# Patient Record
Sex: Female | Born: 1965 | Race: Black or African American | Hispanic: Yes | Marital: Married | Smoking: Never smoker
Health system: Southern US, Community
[De-identification: ages and names within clinical notes are randomized; demographics above are authoritative.]

## PROBLEM LIST (undated history)

## (undated) DIAGNOSIS — B9689 Other specified bacterial agents as the cause of diseases classified elsewhere: Secondary | ICD-10-CM

## (undated) DIAGNOSIS — N83209 Unspecified ovarian cyst, unspecified side: Secondary | ICD-10-CM

## (undated) DIAGNOSIS — N951 Menopausal and female climacteric states: Secondary | ICD-10-CM

## (undated) DIAGNOSIS — Z789 Other specified health status: Secondary | ICD-10-CM

## (undated) DIAGNOSIS — I10 Essential (primary) hypertension: Secondary | ICD-10-CM

## (undated) DIAGNOSIS — N760 Acute vaginitis: Secondary | ICD-10-CM

## (undated) HISTORY — DX: Menopausal and female climacteric states: N95.1

## (undated) HISTORY — PX: NO PAST SURGERIES: SHX2092

## (undated) HISTORY — DX: Other specified bacterial agents as the cause of diseases classified elsewhere: B96.89

## (undated) HISTORY — DX: Unspecified ovarian cyst, unspecified side: N83.209

## (undated) HISTORY — DX: Essential (primary) hypertension: I10

## (undated) HISTORY — DX: Other specified health status: Z78.9

## (undated) HISTORY — PX: LAPAROSCOPIC OVARIAN CYSTECTOMY: SUR786

## (undated) HISTORY — DX: Other specified bacterial agents as the cause of diseases classified elsewhere: N76.0

---

## 2015-02-07 LAB — HIV ANTIBODY (ROUTINE TESTING W REFLEX): HIV: NEGATIVE

## 2015-02-07 LAB — HM HEPATITIS C SCREENING LAB: HM HEPATITIS C SCREENING: NEGATIVE

## 2017-05-23 LAB — HM PAP SMEAR: HM Pap smear: NEGATIVE

## 2017-10-14 ENCOUNTER — Ambulatory Visit (INDEPENDENT_AMBULATORY_CARE_PROVIDER_SITE_OTHER): Payer: BLUE CROSS/BLUE SHIELD | Admitting: Physician Assistant

## 2017-10-14 ENCOUNTER — Encounter: Payer: Self-pay | Admitting: Physician Assistant

## 2017-10-14 VITALS — BP 124/82 | HR 62 | Temp 98.3°F | Resp 16 | Wt 147.0 lb

## 2017-10-14 DIAGNOSIS — Z1231 Encounter for screening mammogram for malignant neoplasm of breast: Secondary | ICD-10-CM

## 2017-10-14 DIAGNOSIS — N915 Oligomenorrhea, unspecified: Secondary | ICD-10-CM | POA: Diagnosis not present

## 2017-10-14 DIAGNOSIS — Z1239 Encounter for other screening for malignant neoplasm of breast: Secondary | ICD-10-CM

## 2017-10-14 DIAGNOSIS — N952 Postmenopausal atrophic vaginitis: Secondary | ICD-10-CM | POA: Diagnosis not present

## 2017-10-14 DIAGNOSIS — D219 Benign neoplasm of connective and other soft tissue, unspecified: Secondary | ICD-10-CM

## 2017-10-14 MED ORDER — TRANEXAMIC ACID 650 MG PO TABS
1300.0000 mg | ORAL_TABLET | Freq: Three times a day (TID) | ORAL | 0 refills | Status: AC
Start: 2017-10-14 — End: 2017-11-13

## 2017-10-14 MED ORDER — ESTRADIOL 0.1 MG/GM VA CREA: 1.0000 | TOPICAL_CREAM | VAGINAL | 1 refills | Status: DC

## 2017-10-14 NOTE — Progress Notes (Signed)
Patient: Rose Marsh Female    DOB: 1965/07/26   52 y.o.   MRN: [id number] Visit Date: 10/14/2017  Today's Provider: Trinna Post, PA-C   Chief Complaint  Patient presents with  . Establish Care   Subjective:    HPI  Previously in [place], now living in [place] [facility] - in [place] in [place], [place]. First language spanish.  Perimenopausal. Has history of uterine fibroids. Takes Lysteda 650 mg tablets, two tablets three times daily for five days during menstrual cycle. Reports intermittent menstrual cycles. Complaining of vaginal dryness, painful sex. Previously on Prempro, no longer.    No Known Allergies   Current Outpatient Medications:  .  cetirizine (ZYRTEC) 10 MG tablet, Take 10 mg by mouth daily., Disp: , Rfl:  .  Cyanocobalamin 100 MCG LOZG, Take by mouth., Disp: , Rfl:  .  ferrous sulfate 325 (65 FE) MG tablet, Take by mouth., Disp: , Rfl:  .  tranexamic acid (LYSTEDA) 650 MG TABS tablet, Take 1,300 mg by mouth 3 (three) times daily. Take a max of 5 days during monthly menstruation., Disp: , Rfl:   Review of Systems  Constitutional: Negative.   HENT: Positive for postnasal drip and sinus pressure.   Eyes: Negative.   Respiratory: Negative.   Cardiovascular: Negative.   Gastrointestinal: Negative.   Endocrine: Negative.  Negative for polyuria.  Genitourinary: Negative.   Musculoskeletal: Negative.   Skin: Negative.   Allergic/Immunologic: Negative.   Neurological: Negative.   Hematological: Negative.   Psychiatric/Behavioral: Negative.     Social History   Tobacco Use  . Smoking status: Never Smoker  . Smokeless tobacco: Never Used  Substance Use Topics  . Alcohol use: Never    Frequency: Never   Objective:   BP 124/82 (BP Location: Right Arm, Patient Position: Sitting, Cuff Size: Normal)   Pulse 62   Temp 98.3 F (36.8 C) (Oral)   Resp  16   Wt 147 lb (66.7 kg)  Vitals:   10/14/17 0904  BP: 124/82  Pulse: 62  Resp: 16  Temp: 98.3 F (36.8 C)  TempSrc: Oral  Weight: 147 lb (66.7 kg)     Physical Exam  Constitutional: She is oriented to person, place, and time. She appears well-developed and well-nourished.  Cardiovascular: Normal rate and regular rhythm.  Pulmonary/Chest: Effort normal and breath sounds normal.  Neurological: She is alert and oriented to person, place, and time.  Skin: Skin is warm and dry.  Psychiatric: She has a normal mood and affect. Her behavior is normal.        Assessment & Plan:     1. Vaginal atrophy  Encouraged to use non-hormonal lubricants as first line. If not effective, can use below. Should follow up with OBGYN. Will fill out FMLA for one day per month for intolerable menopausal symptoms.  - estradiol (ESTRACE VAGINAL) 0.1 MG/GM vaginal cream; Place 1 Applicatorful vaginally 3 (three) times a week.  Dispense: 42.5 g; Refill: 1  2. Fibroids  - Ambulatory referral to Obstetrics / Gynecology  3. Oligomenorrhea, unspecified type  - Ambulatory referral to Obstetrics / Gynecology - tranexamic acid (LYSTEDA) 650 MG TABS tablet; Take 2 tablets (1,300 mg total) by mouth 3 (three) times daily. Take a max of 5 days during monthly menstruation.  Dispense: 180 tablet; Refill: 0  4. Breast cancer screening  - MM Digital Screening; Future  Return  in about 1 year (around 10/15/2018) for CPE.  The entirety of the information documented in the History of Present Illness, Review of Systems and Physical Exam were personally obtained by me. Portions of this information were initially documented by Ashley Royalty, CMA and reviewed by me for thoroughness and accuracy.        Trinna Post, PA-C  [facility]

## 2017-10-14 NOTE — Patient Instructions (Signed)
Uterine Fibroids Uterine fibroids are tissue masses (tumors) that can develop in the womb (uterus). They are also called leiomyomas. This type of tumor is not cancerous (benign) and does not spread to other parts of the body outside of the pelvic area, which is between the hip bones. Occasionally, fibroids may develop in the fallopian tubes, in the cervix, or on the support structures (ligaments) that surround the uterus. You can have one or many fibroids. Fibroids can vary in size, weight, and where they grow in the uterus. Some can become quite large. Most fibroids do not require medical treatment. What are the causes? A fibroid can develop when a single uterine cell keeps growing (replicating). Most cells in the human body have a control mechanism that keeps them from replicating without control. What are the signs or symptoms? Symptoms may include:  Heavy bleeding during your period.  Bleeding or spotting between periods.  Pelvic pain and pressure.  Bladder problems, such as needing to urinate more often (urinary frequency) or urgently.  Inability to reproduce offspring (infertility).  Miscarriages.  How is this diagnosed? Uterine fibroids are diagnosed through a physical exam. Your health care provider may feel the lumpy tumors during a pelvic exam. Ultrasonography and an MRI may be done to determine the size, location, and number of fibroids. How is this treated? Treatment may include:  Watchful waiting. This involves getting the fibroid checked by your health care provider to see if it grows or shrinks. Follow your health care provider's recommendations for how often to have this checked.  Hormone medicines. These can be taken by mouth or given through an intrauterine device (IUD).  Surgery. ? Removing the fibroids (myomectomy) or the uterus (hysterectomy). ? Removing blood supply to the fibroids (uterine artery embolization).  If fibroids interfere with your fertility and you  want to become pregnant, your health care provider may recommend having the fibroids removed. Follow these instructions at home:  Keep all follow-up visits as directed by your health care provider. This is important.  Take over-the-counter and prescription medicines only as told by your health care provider. ? If you were prescribed a hormone treatment, take the hormone medicines exactly as directed.  Ask your health care provider about taking iron pills and increasing the amount of dark green, leafy vegetables in your diet. These actions can help to boost your blood iron levels, which may be affected by heavy menstrual bleeding.  Pay close attention to your period and tell your health care provider about any changes, such as: ? Increased blood flow that requires you to use more pads or tampons than usual per month. ? A change in the number of days that your period lasts per month. ? A change in symptoms that are associated with your period, such as abdominal cramping or back pain. Contact a health care provider if:  You have pelvic pain, back pain, or abdominal cramps that cannot be controlled with medicines.  You have an increase in bleeding between and during periods.  You soak tampons or pads in a half hour or less.  You feel lightheaded, extra tired, or weak. Get help right away if:  You faint.  You have a sudden increase in pelvic pain. This information is not intended to replace advice given to you by your health care provider. Make sure you discuss any questions you have with your health care provider. Document Released: 04/19/2000 Document Revised: 12/21/2015 Document Reviewed: 10/19/2013 Elsevier Interactive Patient Education  2018 Elsevier Inc.  

## 2017-10-15 ENCOUNTER — Telehealth: Payer: Self-pay | Admitting: Physician Assistant

## 2017-10-15 NOTE — Telephone Encounter (Signed)
Received [facility] FMLA form. Placed form in providers box. Thanks CC

## 2017-10-16 ENCOUNTER — Encounter: Payer: Self-pay | Admitting: Physician Assistant

## 2017-10-20 NOTE — Telephone Encounter (Signed)
FYI

## 2017-10-22 NOTE — Telephone Encounter (Signed)
Form completed.

## 2017-11-11 ENCOUNTER — Ambulatory Visit: Payer: BLUE CROSS/BLUE SHIELD | Admitting: Obstetrics and Gynecology

## 2017-11-11 ENCOUNTER — Encounter: Payer: Self-pay | Admitting: Obstetrics and Gynecology

## 2017-11-11 VITALS — BP 124/82 | HR 82 | Ht 67.0 in | Wt 145.0 lb

## 2017-11-11 DIAGNOSIS — N941 Unspecified dyspareunia: Secondary | ICD-10-CM

## 2017-11-11 DIAGNOSIS — N951 Menopausal and female climacteric states: Secondary | ICD-10-CM | POA: Diagnosis not present

## 2017-11-11 DIAGNOSIS — N952 Postmenopausal atrophic vaginitis: Secondary | ICD-10-CM | POA: Diagnosis not present

## 2017-11-11 NOTE — Progress Notes (Signed)
Obstetrics & Gynecology Office Visit   Chief Complaint  Patient presents with  . Pelvic Pain    sharp/cramping pain in LLQ, Irregular bleeding   Referral from [facility], Carles Collet, PA-C  History of Present Illness: 52 y.o. [id number] female who is seen in referral, as above.  She reports having taken Prempro for nearly a year.  She stopped taking the medication in May.  She stopped taking the medication due to breast tenderness. Soon after stopping the medication the hot flashes began again.  She has them frequently.  She also has painful sexual intercourse and vaginal dryness. She was prescribed topical vaginal estradiol 0.01%. However, she has not started this yet.  She has never gone an entire year without a menses. She has them anywhere from every 4-7 months apart and this has been occurring for the past two years.  She had a pelvic ultrasound in 02/2016 that showed a uterus that measured 10 x 5.1 x 6 cm.  Two potentially submucosal fibroids, 2.5 cm and ~1.0 cm. An SIS was performed showing no submucosal fibroids.  There was a hyperechoic structure that measured 0.8 cm with no obvious internal flow.  No adnexal mass or free fluid was seen.  When she does have a menses she takes Lysteda to help with the heaviness of her menses.  Last pap smear was this year in January and this was normal with negative HPV.    She has sleep disturbance and she has not slept well.  She has been taking benadryl and atarax, which has been working.    Past Medical History:  Diagnosis Date  . No known health problems     Past Surgical History:  Procedure Laterality Date  . NO PAST SURGERIES      Gynecologic History: No LMP recorded. (Menstrual status: Irregular Periods).  Obstetric History: [id number], s/p SVD x 1 in 31 - boy.   Family History  Problem Relation Age of Onset  . Arthritis Mother   . Rheum arthritis Mother   . Brain cancer Father   . Stroke Father   . Breast cancer  Sister   . Bone cancer Paternal Grandmother    Social History   Socioeconomic History  . Marital status: Single    Spouse name: Not on file  . Number of children: Not on file  . Years of education: Not on file  . Highest education level: Not on file  Occupational History  . Not on file  Social Needs  . Financial resource strain: Not on file  . Food insecurity:    Worry: Not on file    Inability: Not on file  . Transportation needs:    Medical: Not on file    Non-medical: Not on file  Tobacco Use  . Smoking status: Never Smoker  . Smokeless tobacco: Never Used  Substance and Sexual Activity  . Alcohol use: Yes    Frequency: Never    Comment: infrequently  . Drug use: Never  . Sexual activity: Yes    Birth control/protection: None  Lifestyle  . Physical activity:    Days per week: Not on file    Minutes per session: Not on file  . Stress: Not on file  Relationships  . Social connections:    Talks on phone: Not on file    Gets together: Not on file    Attends religious service: Not on file    Active member of club or organization: Not on file  Attends meetings of clubs or organizations: Not on file    Relationship status: Not on file  . Intimate partner violence:    Fear of current or ex partner: Not on file    Emotionally abused: Not on file    Physically abused: Not on file    Forced sexual activity: Not on file  Other Topics Concern  . Not on file  Social History Narrative  . Not on file   Allergies: No Known Allergies  Prior to Admission medications   Medication Sig Start Date End Date Taking? Authorizing Provider  cetirizine (ZYRTEC) 10 MG tablet Take 10 mg by mouth daily.   Yes [provider]  ferrous sulfate 325 (65 FE) MG tablet Take by mouth.   Yes [provider]  tranexamic acid (LYSTEDA) 650 MG TABS tablet Take 2 tablets (1,300 mg total) by mouth 3 (three) times daily. Take a max of 5 days during monthly menstruation. 10/14/17  11/13/17 Yes Trinna Post, PA-C  vitamin B-12 (CYANOCOBALAMIN) 500 MCG tablet Take 500 mcg by mouth daily.   Yes [provider]  estradiol (ESTRACE VAGINAL) 0.1 MG/GM vaginal cream Place 1 Applicatorful vaginally 3 (three) times a week. Patient not taking: Reported on 11/11/2017 10/15/17   Trinna Post, PA-C    Review of Systems  Constitutional: Negative.   HENT: Negative.   Eyes: Negative.   Respiratory: Negative.   Cardiovascular: Negative.   Gastrointestinal: Negative.   Genitourinary: Negative.   Musculoskeletal: Negative.   Skin: Negative.   Neurological: Negative.   Psychiatric/Behavioral: Negative.      Physical Exam BP 124/82   Pulse 82   Ht 5\' 7"  (1.702 m)   Wt 145 lb (65.8 kg)   BMI 22.71 kg/m  No LMP recorded. (Menstrual status: Irregular Periods). Physical Exam  Constitutional: She is oriented to person, place, and time. She appears well-developed and well-nourished. No distress.  Eyes: EOM are normal. No scleral icterus.  Neck: Normal range of motion. Neck supple.  Cardiovascular: Normal rate and regular rhythm.  Pulmonary/Chest: Effort normal and breath sounds normal. No respiratory distress. She has no wheezes. She has no rales.  Abdominal: Soft. Bowel sounds are normal. She exhibits no distension and no mass. There is no tenderness. There is no rebound and no guarding.  Musculoskeletal: Normal range of motion. She exhibits no edema.  Neurological: She is alert and oriented to person, place, and time. No cranial nerve deficit.  Skin: Skin is warm and dry. No erythema.  Psychiatric: She has a normal mood and affect. Her behavior is normal. Judgment normal.   Assessment: 52 y.o. [id number] female here for  1. Climacteric   2. Hot flushes, perimenopausal   3. Vaginal atrophy   4. Dyspareunia in female      Plan: Problem List Items Addressed This Visit      Genitourinary   Vaginal atrophy     Other   Climacteric - Primary   Relevant  Medications   estrogen, conjugated,-medroxyprogesterone (PREMPRO) 0.3-1.5 MG tablet   Hot flushes, perimenopausal   Relevant Medications   estrogen, conjugated,-medroxyprogesterone (PREMPRO) 0.3-1.5 MG tablet   Dyspareunia in female     We discussed WHI study findings in detail.  In the combined estrogen-progesterone arm breast cancer risk was increased by 1.26 (CI of 1.00 to 1.59), coronary heart disease 1.29 (CI 1.02-1.63), stroke risk 1.41 (1.07-1.85), and pulmonary embolism 2.13 (CI 1.39-3.25).  That being said the while statistically significant the actual number of cases attributable  are relatively small at an addition 8 cases of breast cancer, 7 more coronary artery event, 8 more strokes, and 8 additional case of pulmonary embolism per 10,000 women.  Study was terminated because of the increased breast cancer risk, this was not seen in the progestin only arm of the study for women without an intact uterus.  In addition it is important to note that HRT also had positive or risk reducing effects, and all cause mortality between the HRT/non-HRT users is not statistically different.  Estrogen-progestin HRT decreased the relative risk of hip fracture 0.66 (CI 0.45-0.98), colorectal cancer 0.63 (0.43-0.92).  Current consensus is to limit dose to the lowest effective dose, and shortest treatment duration possible.  Breast cancer risk appeared to increase after 4 years of use.  Also important to note is that these risk refer to systemic HRT for the treatment of vasomotor symptoms, and do not apply to vaginal preperations with minimal systemic absorption and aimed at treating symptoms of vulvovaginal atrophy.    We briefly touched on findings of WHIMS trial published in 2005 which looked at women 47 year of age or older, and whether HRT was protective against the development of dementia.  The study revealed that HRT actually increased the risk for the development of dementia but was limited by looking only  at patients 83 years of age and older.  The subsequent KEEPS trial  In 2012 which looked at HRT in recently postmenopausal women did not show any improvement in cognitive function for women on HRT.  However, there was also no significant cognitive declines seen in recently postmenopausal women receiving HRT as had previously been shown in the WHIMS trial.    After discussion of the above, she would like to restart Prempro.  Will start at lowest dose and assess response. She may hold off on topical vaginal estrogen for now to assess response.  However, she may need topical vaginal estrogen. I reassured her that this would be safe even if she were taking no progesterone.  All questions answered.   30 minutes spent in face to face discussion with > 50% spent in counseling,management, and coordination of care of her climacteric, hot flushes, vaginal atrophy, and dyspareunia.   Return in about 3 months (around 02/11/2018) for medication follow up.  Prentice Docker, MD 11/12/2017 1:46 PM     CC: Trinna Post, PA-C [address] [place], [place] [address]

## 2017-11-11 NOTE — Patient Instructions (Signed)
[facility]  Address: [address], [place], [place] [address]  Phone: [phone]  

## 2017-11-12 ENCOUNTER — Encounter: Payer: Self-pay | Admitting: Obstetrics and Gynecology

## 2017-11-12 DIAGNOSIS — N952 Postmenopausal atrophic vaginitis: Secondary | ICD-10-CM | POA: Insufficient documentation

## 2017-11-12 DIAGNOSIS — N941 Unspecified dyspareunia: Secondary | ICD-10-CM | POA: Insufficient documentation

## 2017-11-12 DIAGNOSIS — N951 Menopausal and female climacteric states: Secondary | ICD-10-CM | POA: Insufficient documentation

## 2017-11-12 MED ORDER — CONJ ESTROG-MEDROXYPROGEST ACE 0.3-1.5 MG PO TABS: 1.0000 | ORAL_TABLET | Freq: Every day | ORAL | 11 refills | Status: DC

## 2018-02-18 ENCOUNTER — Ambulatory Visit
Admission: RE | Admit: 2018-02-18 | Discharge: 2018-02-18 | Disposition: A | Discharge status: Home / Self Care | Payer: BLUE CROSS/BLUE SHIELD | Source: Ambulatory Visit | Attending: Physician Assistant | Admitting: Physician Assistant

## 2018-02-18 DIAGNOSIS — Z1239 Encounter for other screening for malignant neoplasm of breast: Secondary | ICD-10-CM | POA: Diagnosis not present

## 2019-01-22 ENCOUNTER — Encounter: Payer: Self-pay | Admitting: Physician Assistant

## 2019-01-22 ENCOUNTER — Ambulatory Visit (INDEPENDENT_AMBULATORY_CARE_PROVIDER_SITE_OTHER): Payer: Managed Care, Other (non HMO) | Admitting: Physician Assistant

## 2019-01-22 ENCOUNTER — Telehealth: Payer: Self-pay | Admitting: *Deleted

## 2019-01-22 ENCOUNTER — Other Ambulatory Visit: Payer: Self-pay

## 2019-01-22 VITALS — BP 145/108 | HR 75 | Temp 96.6°F | Resp 16 | Ht 67.0 in | Wt 147.0 lb

## 2019-01-22 DIAGNOSIS — Z0000 Encounter for general adult medical examination without abnormal findings: Secondary | ICD-10-CM

## 2019-01-22 DIAGNOSIS — J301 Allergic rhinitis due to pollen: Secondary | ICD-10-CM | POA: Diagnosis not present

## 2019-01-22 DIAGNOSIS — Z1239 Encounter for other screening for malignant neoplasm of breast: Secondary | ICD-10-CM | POA: Diagnosis not present

## 2019-01-22 DIAGNOSIS — R030 Elevated blood-pressure reading, without diagnosis of hypertension: Secondary | ICD-10-CM

## 2019-01-22 DIAGNOSIS — Z23 Encounter for immunization: Secondary | ICD-10-CM

## 2019-01-22 MED ORDER — CETIRIZINE HCL 10 MG PO TABS: 10.0000 mg | ORAL_TABLET | Freq: Every day | ORAL | 1 refills | Status: DC

## 2019-01-22 NOTE — Progress Notes (Deleted)
       Patient: Rose Marsh Female    DOB: 1966/04/19   53 y.o.   MRN: [id number] Visit Date: 01/22/2019  Today's Provider: Trinna Post, PA-C   No chief complaint on file.  Subjective:     HPI  No Known Allergies   Current Outpatient Medications:  .  cetirizine (ZYRTEC) 10 MG tablet, Take 10 mg by mouth daily., Disp: , Rfl:  .  estradiol (ESTRACE VAGINAL) 0.1 MG/GM vaginal cream, Place 1 Applicatorful vaginally 3 (three) times a week. (Patient not taking: Reported on 11/11/2017), Disp: 42.5 g, Rfl: 1 .  estrogen, conjugated,-medroxyprogesterone (PREMPRO) 0.3-1.5 MG tablet, Take 1 tablet by mouth daily., Disp: 30 tablet, Rfl: 11 .  ferrous sulfate 325 (65 FE) MG tablet, Take by mouth., Disp: , Rfl:  .  vitamin B-12 (CYANOCOBALAMIN) 500 MCG tablet, Take 500 mcg by mouth daily., Disp: , Rfl:   Review of Systems  Constitutional: Negative for appetite change, chills, fatigue and fever.  Respiratory: Negative for chest tightness and shortness of breath.   Cardiovascular: Negative for chest pain and palpitations.  Gastrointestinal: Negative for abdominal pain, nausea and vomiting.  Neurological: Negative for dizziness and weakness.    Social History   Tobacco Use  . Smoking status: Never Smoker  . Smokeless tobacco: Never Used  Substance Use Topics  . Alcohol use: Yes    Frequency: Never    Comment: infrequently      Objective:   There were no vitals taken for this visit. There were no vitals filed for this visit.There is no height or weight on file to calculate BMI.   Physical Exam   No results found for any visits on 01/22/19.     Assessment & [place], PA-C  [facility]

## 2019-01-22 NOTE — Telephone Encounter (Signed)
Per Adriana: Can we call her and tell her to check it at home once a day or so. She can call us back with some readings after a week or so. if she wants to activate mychart she can send it that way. It's not usually like that for her.

## 2019-01-22 NOTE — Patient Instructions (Signed)
Health Maintenance, Female Adopting a healthy lifestyle and getting preventive care are important in promoting health and wellness. Ask your health care provider about:  The right schedule for you to have regular tests and exams.  Things you can do on your own to prevent diseases and keep yourself healthy. What should I know about diet, weight, and exercise? Eat a healthy diet   Eat a diet that includes plenty of vegetables, fruits, low-fat dairy products, and lean protein.  Do not eat a lot of foods that are high in solid fats, added sugars, or sodium. Maintain a healthy weight Body mass index (BMI) is used to identify weight problems. It estimates body fat based on height and weight. Your health care provider can help determine your BMI and help you achieve or maintain a healthy weight. Get regular exercise Get regular exercise. This is one of the most important things you can do for your health. Most adults should:  Exercise for at least 150 minutes each week. The exercise should increase your heart rate and make you sweat (moderate-intensity exercise).  Do strengthening exercises at least twice a week. This is in addition to the moderate-intensity exercise.  Spend less time sitting. Even light physical activity can be beneficial. Watch cholesterol and blood lipids Have your blood tested for lipids and cholesterol at 55 years of age, then have this test every 5 years. Have your cholesterol levels checked more often if:  Your lipid or cholesterol levels are high.  You are older than 55 years of age.  You are at high risk for heart disease. What should I know about cancer screening? Depending on your health history and family history, you may need to have cancer screening at various ages. This may include screening for:  Breast cancer.  Cervical cancer.  Colorectal cancer.  Skin cancer.  Lung cancer. What should I know about heart disease, diabetes, and high blood  pressure? Blood pressure and heart disease  High blood pressure causes heart disease and increases the risk of stroke. This is more likely to develop in people who have high blood pressure readings, are of African descent, or are overweight.  Have your blood pressure checked: ? Every 3-5 years if you are 18-39 years of age. ? Every year if you are 40 years old or older. Diabetes Have regular diabetes screenings. This checks your fasting blood sugar level. Have the screening done:  Once every three years after age 40 if you are at a normal weight and have a low risk for diabetes.  More often and at a younger age if you are overweight or have a high risk for diabetes. What should I know about preventing infection? Hepatitis B If you have a higher risk for hepatitis B, you should be screened for this virus. Talk with your health care provider to find out if you are at risk for hepatitis B infection. Hepatitis C Testing is recommended for:  Everyone born from 1945 through 1965.  Anyone with known risk factors for hepatitis C. Sexually transmitted infections (STIs)  Get screened for STIs, including gonorrhea and chlamydia, if: ? You are sexually active and are younger than 55 years of age. ? You are older than 55 years of age and your health care provider tells you that you are at risk for this type of infection. ? Your sexual activity has changed since you were last screened, and you are at increased risk for chlamydia or gonorrhea. Ask your health care provider if   you are at risk.  Ask your health care provider about whether you are at high risk for HIV. Your health care provider may recommend a prescription medicine to help prevent HIV infection. If you choose to take medicine to prevent HIV, you should first get tested for HIV. You should then be tested every 3 months for as long as you are taking the medicine. Pregnancy  If you are about to stop having your period (premenopausal) and  you may become pregnant, seek counseling before you get pregnant.  Take 400 to 800 micrograms (mcg) of folic acid every day if you become pregnant.  Ask for birth control (contraception) if you want to prevent pregnancy. Osteoporosis and menopause Osteoporosis is a disease in which the bones lose minerals and strength with aging. This can result in bone fractures. If you are 65 years old or older, or if you are at risk for osteoporosis and fractures, ask your health care provider if you should:  Be screened for bone loss.  Take a calcium or vitamin D supplement to lower your risk of fractures.  Be given hormone replacement therapy (HRT) to treat symptoms of menopause. Follow these instructions at home: Lifestyle  Do not use any products that contain nicotine or tobacco, such as cigarettes, e-cigarettes, and chewing tobacco. If you need help quitting, ask your health care provider.  Do not use street drugs.  Do not share needles.  Ask your health care provider for help if you need support or information about quitting drugs. Alcohol use  Do not drink alcohol if: ? Your health care provider tells you not to drink. ? You are pregnant, may be pregnant, or are planning to become pregnant.  If you drink alcohol: ? Limit how much you use to 0-1 drink a day. ? Limit intake if you are breastfeeding.  Be aware of how much alcohol is in your drink. In the [place] one drink equals one 12 oz bottle of beer (355 mL), one 5 oz glass of wine (148 mL), or one 1 oz glass of hard liquor (44 mL). General instructions  Schedule regular health, dental, and eye exams.  Stay current with your vaccines.  Tell your health care provider if: ? You often feel depressed. ? You have ever been abused or do not feel safe at home. Summary  Adopting a healthy lifestyle and getting preventive care are important in promoting health and wellness.  Follow your health care provider's instructions about healthy  diet, exercising, and getting tested or screened for diseases.  Follow your health care provider's instructions on monitoring your cholesterol and blood pressure. This information is not intended to replace advice given to you by your health care provider. Make sure you discuss any questions you have with your health care provider. Document Released: 11/05/2010 Document Revised: 04/15/2018 Document Reviewed: 04/15/2018 Elsevier Patient Education  2020 Elsevier Inc.  

## 2019-01-22 NOTE — Progress Notes (Signed)
Patient: Rose Marsh, Female    DOB: 03/07/1966, 53 y.o.   MRN: [id number] Visit Date: 01/22/2019  Today's Provider: Trinna Post, PA-C   Chief Complaint  Patient presents with  . Annual Exam   Subjective:     Annual physical exam Rose Marsh is a 53 y.o. female who presents today for health maintenance and complete physical. She feels well. She reports exercising yes/welcome. She reports she is sleeping fairly well.  Colonoscopy: 01/31/2014 at [facility], patient reports it's normal, path report not available. Mammo: 02/18/2018 normal PAP: 05/23/2017 normal  -----------------------------------------------------------   Review of Systems  Genitourinary: Positive for vaginal discharge.  Allergic/Immunologic: Positive for environmental allergies.  All other systems reviewed and are negative.   Social History      She  reports that she has never smoked. She has never used smokeless tobacco. She reports current alcohol use. She reports that she does not use drugs.       Social History   Socioeconomic History  . Marital status: Single    Spouse name: Not on file  . Number of children: Not on file  . Years of education: Not on file  . Highest education level: Not on file  Occupational History  . Not on file  Social Needs  . Financial resource strain: Not on file  . Food insecurity    Worry: Not on file    Inability: Not on file  . Transportation needs    Medical: Not on file    Non-medical: Not on file  Tobacco Use  . Smoking status: Never Smoker  . Smokeless tobacco: Never Used  Substance and Sexual Activity  . Alcohol use: Yes    Frequency: Never    Comment: infrequently  . Drug use: Never  . Sexual activity: Yes    Birth control/protection: None  Lifestyle  . Physical activity    Days per week: Not on file    Minutes per session: Not on file  . Stress: Not on file  Relationships  . Social Herbalist on phone: Not on file   Gets together: Not on file    Attends religious service: Not on file    Active member of club or organization: Not on file    Attends meetings of clubs or organizations: Not on file    Relationship status: Not on file  Other Topics Concern  . Not on file  Social History Narrative  . Not on file    Past Medical History:  Diagnosis Date  . No known health problems      Patient Active Problem List   Diagnosis Date Noted  . Climacteric 11/12/2017  . Hot flushes, perimenopausal 11/12/2017  . Vaginal atrophy 11/12/2017  . Dyspareunia in female 11/12/2017    Past Surgical History:  Procedure Laterality Date  . NO PAST SURGERIES      Family History        Family Status  Relation Name Status  . Mother  Alive  . Father  Deceased  . Sister  Alive  . PGM  Deceased        Her family history includes Arthritis in her mother; Bone cancer in her paternal grandmother; Brain cancer in her father; Breast cancer in her sister; Rheum arthritis in her mother; Stroke in her father.      No Known Allergies   Current Outpatient Medications:  .  cetirizine (ZYRTEC) 10 MG tablet, Take 10 mg by  mouth daily., Disp: , Rfl:  .  ferrous sulfate 325 (65 FE) MG tablet, Take by mouth., Disp: , Rfl:  .  vitamin B-12 (CYANOCOBALAMIN) 500 MCG tablet, Take 500 mcg by mouth daily., Disp: , Rfl:  .  estradiol (ESTRACE VAGINAL) 0.1 MG/GM vaginal cream, Place 1 Applicatorful vaginally 3 (three) times a week. (Patient not taking: Reported on 01/22/2019), Disp: 42.5 g, Rfl: 1 .  estrogen, conjugated,-medroxyprogesterone (PREMPRO) 0.3-1.5 MG tablet, Take 1 tablet by mouth daily. (Patient not taking: Reported on 01/22/2019), Disp: 30 tablet, Rfl: 11   Patient Care Team: Paulene Floor as PCP - General (Physician Assistant)    Objective:    Vitals: BP (!) 145/108 (BP Location: Right Arm, Patient Position: Sitting, Cuff Size: Large)   Pulse 75   Temp (!) 96.6 F (35.9 C) (Other (Comment))   Resp  16   Ht 5\' 7"  (1.702 m)   Wt 147 lb (66.7 kg)   SpO2 99%   BMI 23.02 kg/m    Vitals:   01/22/19 1402  BP: (!) 145/108  Pulse: 75  Resp: 16  Temp: (!) 96.6 F (35.9 C)  TempSrc: Other (Comment)  SpO2: 99%  Weight: 147 lb (66.7 kg)  Height: 5\' 7"  (1.702 m)     Physical Exam Constitutional:      Appearance: Normal appearance.  Cardiovascular:     Rate and Rhythm: Normal rate and regular rhythm.     Heart sounds: Normal heart sounds.  Pulmonary:     Effort: Pulmonary effort is normal.     Breath sounds: Normal breath sounds.  Abdominal:     General: Abdomen is flat. Bowel sounds are normal.     Palpations: Abdomen is soft.  Skin:    General: Skin is warm and dry.  Neurological:     Mental Status: She is alert and oriented to person, place, and time. Mental status is at baseline.  Psychiatric:        Mood and Affect: Mood normal.        Behavior: Behavior normal.      Depression Screen PHQ 2/9 Scores 10/14/2017  PHQ - 2 Score 0  PHQ- 9 Score 0       Assessment & Plan:     Routine Health Maintenance and Physical Exam  Exercise Activities and Dietary recommendations Goals   None     Immunization History  Administered Date(s) Administered  . Influenza, Seasonal, Injecte, Preservative Fre 02/04/2016  . Influenza,inj,Quad PF,6+ Mos 02/07/2015  . PPD Test 03/29/2013  . Tdap 10/25/2011, 03/29/2013    Health Maintenance  Topic Date Due  . COLONOSCOPY  02/20/2014  . INFLUENZA VACCINE  12/05/2018  . MAMMOGRAM  02/19/2020  . PAP SMEAR-Modifier  05/23/2020  . TETANUS/TDAP  03/30/2023  . Hepatitis C Screening  Completed  . HIV Screening  Completed     Discussed health benefits of physical activity, and encouraged her to engage in regular exercise appropriate for her age and condition.    1. Annual physical exam  - Comprehensive Metabolic Panel (CMET) - CBC with Differential - TSH - Lipid Profile  2. Need for influenza vaccination  - Flu  Vaccine QUAD 36+ mos IM  3. Breast cancer screening  - MM Digital Screening; Future  4. Allergic rhinitis due to pollen, unspecified seasonality  - cetirizine (ZYRTEC) 10 MG tablet; Take 1 tablet (10 mg total) by mouth daily.  Dispense: 90 tablet; Refill: 1  5. Elevated BP  Will have patient check  BP at home and call back with readings. Historically, it has not been elevated.   The entirety of the information documented in the History of Present Illness, Review of Systems and Physical Exam were personally obtained by me. Portions of this information were initially documented by April M. Sabra Heck, CMA and reviewed by me for thoroughness and accuracy.   F/u 1 year CPE --------------------------------------------------------------------    Trinna Post, PA-C  [facility]

## 2019-01-22 NOTE — Telephone Encounter (Signed)
LMOVM for pt to return call 

## 2019-01-23 LAB — COMPREHENSIVE METABOLIC PANEL
ALT: 18 IU/L (ref 0–32)
AST: 22 IU/L (ref 0–40)
Albumin/Globulin Ratio: 2.4 — ABNORMAL HIGH (ref 1.2–2.2)
Albumin: 4.7 g/dL (ref 3.8–4.9)
Alkaline Phosphatase: 52 IU/L (ref 39–117)
BUN/Creatinine Ratio: 15 (ref 9–23)
BUN: 10 mg/dL (ref 6–24)
Bilirubin Total: 0.3 mg/dL (ref 0.0–1.2)
CO2: 25 mmol/L (ref 20–29)
Calcium: 9.5 mg/dL (ref 8.7–10.2)
Chloride: 102 mmol/L (ref 96–106)
Creatinine, Ser: 0.68 mg/dL (ref 0.57–1.00)
GFR calc Af Amer: 115 mL/min/{1.73_m2} (ref >59)
GFR calc non Af Amer: 99 mL/min/{1.73_m2} (ref >59)
Globulin, Total: 2 g/dL (ref 1.5–4.5)
Glucose: 79 mg/dL (ref 65–99)
Potassium: 4.1 mmol/L (ref 3.5–5.2)
Sodium: 139 mmol/L (ref 134–144)
Total Protein: 6.7 g/dL (ref 6.0–8.5)

## 2019-01-23 LAB — LIPID PANEL
Chol/HDL Ratio: 2 ratio (ref 0.0–4.4)
Cholesterol, Total: 190 mg/dL (ref 100–199)
HDL: 93 mg/dL (ref >39)
LDL Chol Calc (NIH): 88 mg/dL (ref 0–99)
Triglycerides: 48 mg/dL (ref 0–149)
VLDL Cholesterol Cal: 9 mg/dL (ref 5–40)

## 2019-01-23 LAB — TSH: TSH: 1.53 u[IU]/mL (ref 0.450–4.500)

## 2019-01-23 LAB — CBC WITH DIFFERENTIAL/PLATELET
Basophils Absolute: 0.1 10*3/uL (ref 0.0–0.2)
Basos: 1 %
EOS (ABSOLUTE): 0 10*3/uL (ref 0.0–0.4)
Eos: 1 %
Hematocrit: 41.6 % (ref 34.0–46.6)
Hemoglobin: 14.1 g/dL (ref 11.1–15.9)
Immature Grans (Abs): 0 10*3/uL (ref 0.0–0.1)
Immature Granulocytes: 0 %
Lymphocytes Absolute: 2.7 10*3/uL (ref 0.7–3.1)
Lymphs: 41 %
MCH: 27.9 pg (ref 26.6–33.0)
MCHC: 33.9 g/dL (ref 31.5–35.7)
MCV: 82 fL (ref 79–97)
Monocytes Absolute: 0.5 10*3/uL (ref 0.1–0.9)
Monocytes: 7 %
Neutrophils Absolute: 3.3 10*3/uL (ref 1.4–7.0)
Neutrophils: 50 %
Platelets: 254 10*3/uL (ref 150–450)
RBC: 5.06 x10E6/uL (ref 3.77–5.28)
RDW: 14.1 % (ref 11.7–15.4)
WBC: 6.5 10*3/uL (ref 3.4–10.8)

## 2019-01-25 NOTE — Telephone Encounter (Signed)
-----   Message from Trinna Post, [place] sent at 01/25/2019  1:21 PM EDT ----- Bloodwork looks good. If she hasn't already, can she take her BP a few times at home and let us know the results. Thanks.

## 2019-01-25 NOTE — Telephone Encounter (Signed)
LMTCB 01/25/2019  Thanks,   -Vanesa Renier  

## 2019-02-01 ENCOUNTER — Telehealth: Payer: Self-pay | Admitting: *Deleted

## 2019-02-01 ENCOUNTER — Encounter: Payer: Self-pay | Admitting: Physician Assistant

## 2019-02-01 NOTE — Telephone Encounter (Signed)
Pt advised.  She states her Blood pressure has been really high at home:  145/100 172/?  Please advise.  Thanks,   -Mickel Baas

## 2019-02-01 NOTE — Telephone Encounter (Signed)
lmtcb

## 2019-02-01 NOTE — Telephone Encounter (Signed)
Patient is returning call.  °

## 2019-02-01 NOTE — Telephone Encounter (Signed)
Still elevated. I'd recommend she take a month to increase her exercise, stop decongestants, no NSAIDs and return to clinic for follow up to discuss treating this. If she would like to follow up sooner, go ahead and schedule phone visit.

## 2019-02-02 ENCOUNTER — Inpatient Hospital Stay: Admission: RE | Admit: 2019-02-02 | Payer: BLUE CROSS/BLUE SHIELD | Source: Ambulatory Visit

## 2019-02-02 ENCOUNTER — Ambulatory Visit (INDEPENDENT_AMBULATORY_CARE_PROVIDER_SITE_OTHER)
Admission: RE | Admit: 2019-02-02 | Discharge: 2019-02-02 | Disposition: A | Discharge status: Home / Self Care | Payer: Managed Care, Other (non HMO) | Source: Ambulatory Visit

## 2019-02-02 ENCOUNTER — Other Ambulatory Visit: Payer: Self-pay

## 2019-02-02 DIAGNOSIS — R030 Elevated blood-pressure reading, without diagnosis of hypertension: Secondary | ICD-10-CM

## 2019-02-02 MED ORDER — AMLODIPINE BESYLATE 5 MG PO TABS: 5.0000 mg | ORAL_TABLET | Freq: Every day | ORAL | 0 refills | Status: DC

## 2019-02-02 NOTE — Discharge Instructions (Signed)
Starting you on amlodipine for blood pressure Please call  your doctor for follow up.  If you start having chest pain, SOB, swelling, dizziness, vision changes you need to be seen in person Keep monitoring your blood pressures at home.

## 2019-02-02 NOTE — ED Provider Notes (Signed)
Virtual Visit via Video Note:  Development worker, community  initiated request for Telemedicine visit with [facility] team. I connected with Rose Marsh  on 02/02/2019 at 3:49 PM  for a synchronized telemedicine visit using a video enabled HIPPA compliant telemedicine application. I verified that I am speaking with Rose Marsh  using two identifiers. Rose July, NP  was physically located in a Rose Marsh and Rose Marsh was located at a different location.   The limitations of evaluation and management by telemedicine as well as the availability of in-person appointments were discussed. Patient was informed that she  may incur a bill ( including co-pay) for this virtual visit encounter. Rose Marsh  expressed understanding and gave verbal consent to proceed with virtual visit.     History of Present Illness:Rose Marsh  is a 53 y.o. female presents with elevated BP readings. This has been ongoing since the 26th. Had an elevated BP reading at her PCP office 2 weeks ago. She has had intermittent HA. No dizziness, blurred vision, weakness, slurred speech, numbness or tingling. Readings are: 158/102, 136/102, 142/110, 163/113, 147/103. HR has been in the 80s. Post menopausal. She is very active ans walks 3 to 4 days a week, drinks water and eats healthy. Family hx of HTN.   Past Medical History:  Diagnosis Date  . No known health problems     No Known Allergies      Observations/Objective:VITALS: Per patient if applicable, see vitals. GENERAL: Alert, appears well and in no acute distress. HEENT: Atraumatic, conjunctiva clear, no obvious abnormalities on inspection of external nose and ears. NECK: Normal movements of the head and neck. CARDIOPULMONARY: No increased WOB. Speaking in clear sentences. I:E ratio WNL.  MS: Moves all visible extremities without noticeable abnormality. PSYCH: Pleasant and cooperative, well-groomed. Speech normal rate  and rhythm. Affect is appropriate. Insight and judgement are appropriate. Attention is focused, linear, and appropriate.  NEURO: CN grossly intact. Oriented as arrived to appointment on time with no prompting. Moves both UE equally.  SKIN: No obvious lesions, wounds, erythema, or cyanosis noted on face or hands.     Assessment and Plan: multiple elevated BP readings over the past 2 weeks. Pt requesting to be started on BP medication. Was unable to get in contact with her PCP. I feel it is reasonable to start her on BP meds. Starting on Amlodipine. Will have her monitor her BPs and follow up with PCP.     Follow Up Instructions:Follow up with PCP   I discussed the assessment and treatment plan with the patient. The patient was provided an opportunity to ask questions and all were answered. The patient agreed with the plan and demonstrated an understanding of the instructions.   The patient was advised to call back or seek an in-person evaluation if the symptoms worsen or if the condition fails to improve as anticipated.      Rose July, NP  02/02/2019 3:49 PM         Rose July, NP 02/03/19 1104

## 2019-02-02 NOTE — Telephone Encounter (Signed)
Can we please schedule this patient for a telephone visit to discuss her BP? Thanks.

## 2019-02-03 NOTE — Telephone Encounter (Signed)
Patient advised as below. Patient verbalizes understanding and is in agreement with treatment plan.  

## 2019-02-05 ENCOUNTER — Encounter: Payer: Self-pay | Admitting: Physician Assistant

## 2019-02-05 ENCOUNTER — Ambulatory Visit (INDEPENDENT_AMBULATORY_CARE_PROVIDER_SITE_OTHER): Payer: Managed Care, Other (non HMO) | Admitting: Physician Assistant

## 2019-02-05 ENCOUNTER — Other Ambulatory Visit: Payer: Self-pay

## 2019-02-05 VITALS — BP 145/94 | HR 69 | Temp 97.3°F | Ht 67.0 in | Wt 144.0 lb

## 2019-02-05 DIAGNOSIS — I10 Essential (primary) hypertension: Secondary | ICD-10-CM | POA: Diagnosis not present

## 2019-02-05 MED ORDER — AMLODIPINE BESYLATE 10 MG PO TABS: 10.0000 mg | ORAL_TABLET | Freq: Every day | ORAL | 0 refills | Status: DC

## 2019-02-05 NOTE — Patient Instructions (Addendum)

## 2019-02-05 NOTE — Progress Notes (Signed)
Patient: Rose Marsh Female    DOB: 09-13-1965   53 y.o.   MRN: [id number] Visit Date: 02/05/2019  Today's Provider: Trinna Post, PA-C   Chief Complaint  Patient presents with  . Follow-up   Subjective:     HPI   Patient here today to follow up elevated blood pressures. Patient reports she was unable to get a response from this clinic. Says people did pick up the phone but she didn't schedule an appointment because she wanted to talk to the nurse directly.  She then went to urgent care on 02/02/2019. Patient was started on amlodipine 5mg  daily. Patient reports good tolerance and compliance with medication. Patient denies any abnormal side effects. Patient has a list of her bp readings 158/102, 136/102, 142/110, 163/113, 147/103, 149/99, 135/98, 144/100. Patient reports that she has two taken two doses of medication.  Headaches: every day, not waking up at night. No vision changes. Concerned because father had headaches prior to being diagnosed with brain tumor.   BP Readings from Last 3 Encounters:  02/05/19 (!) 145/94  01/22/19 (!) 145/108  11/11/17 124/82      No Known Allergies   Current Outpatient Medications:  .  amLODipine (NORVASC) 5 MG tablet, Take 1 tablet (5 mg total) by mouth daily., Disp: 30 tablet, Rfl: 0 .  cetirizine (ZYRTEC) 10 MG tablet, Take 1 tablet (10 mg total) by mouth daily., Disp: 90 tablet, Rfl: 1 .  ferrous sulfate 325 (65 FE) MG tablet, Take by mouth., Disp: , Rfl:  .  hydrOXYzine (ATARAX/VISTARIL) 10 MG tablet, TAKE 1 TABLET (10 MG TOTAL) BY MOUTH AS DIRECTED (PRN FOR INSOMNIA), Disp: , Rfl:   Review of Systems  Constitutional: Negative.   Eyes: Negative.   Respiratory: Negative.   Cardiovascular: Negative.     Social History   Tobacco Use  . Smoking status: Never Smoker  . Smokeless tobacco: Never Used  Substance Use Topics  . Alcohol use: Yes    Frequency: Never    Comment: infrequently      Objective:   BP (!)  145/94 (BP Location: Left Arm, Patient Position: Sitting, Cuff Size: Normal)   Pulse 69   Temp (!) 97.3 F (36.3 C) (Temporal)   Ht 5\' 7"  (1.702 m)   Wt 144 lb (65.3 kg)   BMI 22.55 kg/m  Vitals:   02/05/19 1340  BP: (!) 145/94  Pulse: 69  Temp: (!) 97.3 F (36.3 C)  TempSrc: Temporal  Weight: 144 lb (65.3 kg)  Height: 5\' 7"  (1.702 m)  Body mass index is 22.55 kg/m.   Physical Exam   No results found for any visits on 02/05/19.     Assessment & Plan    1. Essential hypertension  EKG normal. Started 5 mg amlodipine at urgent care, has been on this dose for two days. I think this is too early to make any adjustments. Suggest she remain on this for one week. If after one week, BP > 135/90 a majority of the time, she should increase amlodipine to 10 mg daily. F/u 4-6 weeks at which time we will reassess headaches. If still present, will get CT scan. Counseled on pedal edema.   - EKG 12-Lead - amLODipine (NORVASC) 10 MG tablet; Take 1 tablet (10 mg total) by mouth daily.  Dispense: 90 tablet; Refill: 0  The entirety of the information documented in the History of Present Illness, Review of Systems and Physical Exam were personally obtained  by me. Portions of this information were initially documented by Lynford Humphrey, CMA and reviewed by me for thoroughness and accuracy.        Trinna Post, PA-C  [facility]

## 2019-03-07 ENCOUNTER — Other Ambulatory Visit: Payer: Self-pay | Admitting: Physician Assistant

## 2019-03-07 DIAGNOSIS — I10 Essential (primary) hypertension: Secondary | ICD-10-CM

## 2019-03-08 NOTE — Telephone Encounter (Signed)
[name] was on 02/05/2019.

## 2019-03-19 ENCOUNTER — Ambulatory Visit
Admission: RE | Admit: 2019-03-19 | Discharge: 2019-03-19 | Disposition: A | Discharge status: Home / Self Care | Payer: Managed Care, Other (non HMO) | Source: Ambulatory Visit | Attending: Physician Assistant | Admitting: Physician Assistant

## 2019-03-19 ENCOUNTER — Ambulatory Visit (INDEPENDENT_AMBULATORY_CARE_PROVIDER_SITE_OTHER): Payer: Managed Care, Other (non HMO) | Admitting: Physician Assistant

## 2019-03-19 ENCOUNTER — Other Ambulatory Visit: Payer: Self-pay

## 2019-03-19 ENCOUNTER — Encounter: Payer: Self-pay | Admitting: Physician Assistant

## 2019-03-19 ENCOUNTER — Other Ambulatory Visit: Payer: Self-pay | Admitting: Physician Assistant

## 2019-03-19 VITALS — BP 128/88 | Temp 98.2°F | Wt 143.4 lb

## 2019-03-19 DIAGNOSIS — I10 Essential (primary) hypertension: Secondary | ICD-10-CM | POA: Diagnosis not present

## 2019-03-19 DIAGNOSIS — Z1231 Encounter for screening mammogram for malignant neoplasm of breast: Secondary | ICD-10-CM

## 2019-03-19 MED ORDER — AMLODIPINE BESYLATE 5 MG PO TABS: 5.0000 mg | ORAL_TABLET | Freq: Every day | ORAL | 0 refills | Status: DC

## 2019-03-19 NOTE — Patient Instructions (Signed)

## 2019-03-19 NOTE — Progress Notes (Signed)
Patient: Rose Marsh Female    DOB: 08-Nov-1965   53 y.o.   MRN: [id number] Visit Date: 03/19/2019  Today's Provider: Trinna Post, PA-C   Chief Complaint  Patient presents with  . Hypertension   Subjective:     HPI  Hypertension, follow-up:  BP Readings from Last 3 Encounters:  03/19/19 128/88  02/05/19 (!) 145/94  01/22/19 (!) 145/108    She was last seen for hypertension 6 weeks ago.  BP at that visit was 145/94. Management changes since that visit include start Amlodipine 10 MG daily but patient states she is taking 5 mg. She ultimately did not have to increase to 10 mg daily. She reports good compliance with treatment. She is not having side effects.  She is exercising. She is adherent to low salt diet.   Outside blood pressures are normal. She is experiencing none.  Patient denies chest pain, chest pressure/discomfort, exertional chest pressure/discomfort, fatigue, irregular heart beat, lower extremity edema and palpitations.   Cardiovascular risk factors include hypertension.  Use of agents associated with hypertension: none.     Weight trend: stable Wt Readings from Last 3 Encounters:  03/19/19 143 lb 6.4 oz (65 kg)  02/05/19 144 lb (65.3 kg)  01/22/19 147 lb (66.7 kg)    Current diet: well balanced  ------------------------------------------------------------------------     No Known Allergies   Current Outpatient Medications:  .  cetirizine (ZYRTEC) 10 MG tablet, Take 1 tablet (10 mg total) by mouth daily., Disp: 90 tablet, Rfl: 1 .  ferrous sulfate 325 (65 FE) MG tablet, Take by mouth., Disp: , Rfl:  .  hydrOXYzine (ATARAX/VISTARIL) 10 MG tablet, TAKE 1 TABLET (10 MG TOTAL) BY MOUTH AS DIRECTED (PRN FOR INSOMNIA), Disp: , Rfl:  .  amLODipine (NORVASC) 5 MG tablet, Take 1 tablet (5 mg total) by mouth daily., Disp: 90 tablet, Rfl: 0  Review of Systems  Constitutional: Negative.   Respiratory: Negative.   Genitourinary:  Negative.     Social History   Tobacco Use  . Smoking status: Never Smoker  . Smokeless tobacco: Never Used  Substance Use Topics  . Alcohol use: Yes    Frequency: Never    Comment: infrequently      Objective:   BP 128/88 (BP Location: Left Arm, Patient Position: Sitting, Cuff Size: Normal)   Temp 98.2 F (36.8 C) (Temporal)   Wt 143 lb 6.4 oz (65 kg)   BMI 22.46 kg/m  Vitals:   03/19/19 1339  BP: 128/88  Temp: 98.2 F (36.8 C)  TempSrc: Temporal  Weight: 143 lb 6.4 oz (65 kg)  Body mass index is 22.46 kg/m.   Physical Exam Constitutional:      Appearance: Normal appearance.  Cardiovascular:     Rate and Rhythm: Normal rate and regular rhythm.     Heart sounds: Normal heart sounds.  Pulmonary:     Effort: Pulmonary effort is normal.     Breath sounds: Normal breath sounds.  Skin:    General: Skin is warm and dry.  Neurological:     Mental Status: She is alert and oriented to person, place, and time. Mental status is at baseline.  Psychiatric:        Mood and Affect: Mood normal.        Behavior: Behavior normal.      No results found for any visits on 03/19/19.     Assessment & Plan    1. Essential hypertension  Continue  amlodipine 5 mg QD and follow up once yearly during physical for recheck. [facility] card provided and OBGYN number provided.   - amLODipine (NORVASC) 5 MG tablet; Take 1 tablet (5 mg total) by mouth daily.  Dispense: 90 tablet; Refill: 0  The entirety of the information documented in the History of Present Illness, Review of Systems and Physical Exam were personally obtained by me. Portions of this information were initially documented by [facility], CMA and reviewed by me for thoroughness and accuracy.         Trinna Post, PA-C  [facility]

## 2019-05-14 ENCOUNTER — Encounter: Payer: Self-pay | Admitting: Obstetrics and Gynecology

## 2019-05-14 ENCOUNTER — Other Ambulatory Visit: Payer: Self-pay

## 2019-05-14 ENCOUNTER — Ambulatory Visit (INDEPENDENT_AMBULATORY_CARE_PROVIDER_SITE_OTHER): Payer: Managed Care, Other (non HMO) | Admitting: Obstetrics and Gynecology

## 2019-05-14 VITALS — BP 108/72 | HR 76 | Ht 67.0 in | Wt 146.0 lb

## 2019-05-14 DIAGNOSIS — N951 Menopausal and female climacteric states: Secondary | ICD-10-CM

## 2019-05-14 DIAGNOSIS — Z1231 Encounter for screening mammogram for malignant neoplasm of breast: Secondary | ICD-10-CM

## 2019-05-14 DIAGNOSIS — Z01419 Encounter for gynecological examination (general) (routine) without abnormal findings: Secondary | ICD-10-CM | POA: Diagnosis not present

## 2019-05-14 DIAGNOSIS — N952 Postmenopausal atrophic vaginitis: Secondary | ICD-10-CM

## 2019-05-14 DIAGNOSIS — Z803 Family history of malignant neoplasm of breast: Secondary | ICD-10-CM

## 2019-05-14 MED ORDER — ESTRADIOL-NORETHINDRONE ACET 0.5-0.1 MG PO TABS: 1.0000 | ORAL_TABLET | Freq: Every day | ORAL | 3 refills | Status: DC

## 2019-05-14 MED ORDER — ESTRADIOL 0.1 MG/GM VA CREA: TOPICAL_CREAM | VAGINAL | 1 refills | Status: DC

## 2019-05-14 NOTE — Patient Instructions (Signed)
I value your feedback and entrusting us with your care. If you get a [facility] patient survey, I would appreciate you taking the time to let us know about your experience today. Thank you!  As of April 15, 2019, your lab results will be released to your MyChart immediately, before I even have a chance to see them. Please give me time to review them and contact you if there are any abnormalities. Thank you for your patience.  

## 2019-05-14 NOTE — Progress Notes (Signed)
PCP: Trinna Post, PA-C   Chief Complaint  Patient presents with  . Gynecologic Exam    hot flashes    HPI:      [name] is a 54 y.o. [id number] who LMP was No LMP recorded. (Menstrual status: Perimenopausal)., presents today for her annual examination.  Her menses are absent due to menopause (>1 yr without bleeding). No PMB. She does have vasomotor sx all the time. Did prempro a couple yrs ago and stopped after a yr. Wasn't affordable but did help with sx and moods. Would like to restart HRT.   Sex activity: not sexually active. She does have vaginal dryness and is using estrace crm with sx control. Wants to continue.  Last Pap: May 23, 2017  Results were: no abnormalities with PCP.  Last mammogram: March 19, 2019  Results were: normal--routine follow-up in 12 months There is a FH of breast cancer in her sister, genetic testing not done. There is no FH of ovarian cancer. The patient does do self-breast exams.  Colonoscopy: age 80 without abnormalities;  Repeat due after 10 years.   Tobacco use: The patient denies current or previous tobacco use. Alcohol use: none  No drug use. Exercise: moderately active  She does get adequate calcium and Vitamin D in her diet.  Labs with PCP.   Patient Active Problem List   Diagnosis Date Noted  . Hypertension 02/05/2019  . Climacteric 11/12/2017  . Hot flushes, perimenopausal 11/12/2017  . Vaginal atrophy 11/12/2017  . Dyspareunia in female 11/12/2017    Past Surgical History:  Procedure Laterality Date  . NO PAST SURGERIES      Family History  Problem Relation Age of Onset  . Arthritis Mother   . Rheum arthritis Mother   . Brain cancer Father   . Stroke Father   . Breast cancer Sister        2s  . Bone cancer Paternal Grandmother     Social History   Socioeconomic History  . Marital status: Single    Spouse name: Not on file  . Number of children: Not on file  . Years of education: Not on  file  . Highest education level: Not on file  Occupational History  . Not on file  Tobacco Use  . Smoking status: Never Smoker  . Smokeless tobacco: Never Used  Substance and Sexual Activity  . Alcohol use: Yes    Comment: infrequently  . Drug use: Never  . Sexual activity: Yes    Birth control/protection: None  Other Topics Concern  . Not on file  Social History Narrative  . Not on file   Social Determinants of Health   Financial Resource Strain:   . Difficulty of Paying Living Expenses: Not on file  Food Insecurity:   . Worried About Charity fundraiser in the Last Year: Not on file  . Ran Out of Food in the Last Year: Not on file  Transportation Needs:   . Lack of Transportation (Medical): Not on file  . Lack of Transportation (Non-Medical): Not on file  Physical Activity:   . Days of Exercise per Week: Not on file  . Minutes of Exercise per Session: Not on file  Stress:   . Feeling of Stress : Not on file  Social Connections:   . Frequency of Communication with Friends and Family: Not on file  . Frequency of Social Gatherings with Friends and Family: Not on file  . Attends Religious Services:  Not on file  . Active Member of Clubs or Organizations: Not on file  . Attends Archivist Meetings: Not on file  . Marital Status: Not on file  Intimate Partner Violence:   . Fear of Current or Ex-Partner: Not on file  . Emotionally Abused: Not on file  . Physically Abused: Not on file  . Sexually Abused: Not on file     Current Outpatient Medications:  .  amLODipine (NORVASC) 10 MG tablet, Take 10 mg by mouth daily., Disp: , Rfl:  .  cetirizine (ZYRTEC) 10 MG tablet, Take 1 tablet (10 mg total) by mouth daily., Disp: 90 tablet, Rfl: 1 .  ferrous sulfate 325 (65 FE) MG tablet, Take by mouth., Disp: , Rfl:  .  hydrOXYzine (ATARAX/VISTARIL) 10 MG tablet, TAKE 1 TABLET (10 MG TOTAL) BY MOUTH AS DIRECTED (PRN FOR INSOMNIA), Disp: , Rfl:  .  pyridOXINE (VITAMIN B-6)  25 MG tablet, Take by mouth., Disp: , Rfl:  .  estradiol (ESTRACE) 0.1 MG/GM vaginal cream, Insert 1g  once weekly as maintenace, Disp: 42.5 g, Rfl: 1 .  Estradiol-Norethindrone Acet 0.5-0.1 MG tablet, Take 1 tablet by mouth daily., Disp: 90 tablet, Rfl: 3     ROS:  Review of Systems  Constitutional: Negative for fatigue, fever and unexpected weight change.  Respiratory: Negative for cough, shortness of breath and wheezing.   Cardiovascular: Negative for chest pain, palpitations and leg swelling.  Gastrointestinal: Negative for blood in stool, constipation, diarrhea, nausea and vomiting.  Endocrine: Negative for cold intolerance, heat intolerance and polyuria.  Genitourinary: Negative for dyspareunia, dysuria, flank pain, frequency, genital sores, hematuria, menstrual problem, pelvic pain, urgency, vaginal bleeding, vaginal discharge and vaginal pain.  Musculoskeletal: Negative for back pain, joint swelling and myalgias.  Skin: Negative for rash.  Neurological: Negative for dizziness, syncope, light-headedness, numbness and headaches.  Hematological: Negative for adenopathy.  Psychiatric/Behavioral: Negative for agitation, confusion, sleep disturbance and suicidal ideas. The patient is not nervous/anxious.   BREAST: No symptoms    Objective: BP 108/72 (BP Location: Left Arm, Patient Position: Sitting, Cuff Size: Normal)   Pulse 76   Ht 5\' 7"  (1.702 m)   Wt 146 lb (66.2 kg)   BMI 22.87 kg/m    Physical Exam Constitutional:      Appearance: She is well-developed.  Genitourinary:     Vulva, vagina, cervix, uterus, right adnexa and left adnexa normal.     No vulval lesion or tenderness noted.     No vaginal discharge, erythema or tenderness.     No cervical polyp.     Uterus is not enlarged or tender.     No right or left adnexal mass present.     Right adnexa not tender.     Left adnexa not tender.  Neck:     Thyroid: No thyromegaly.  Cardiovascular:     Rate and Rhythm:  Normal rate and regular rhythm.     Heart sounds: Normal heart sounds. No murmur.  Pulmonary:     Effort: Pulmonary effort is normal.     Breath sounds: Normal breath sounds.  Chest:     Breasts:        Right: No mass, nipple discharge, skin change or tenderness.        Left: No mass, nipple discharge, skin change or tenderness.  Abdominal:     Palpations: Abdomen is soft.     Tenderness: There is no abdominal tenderness. There is no guarding.  Musculoskeletal:  General: Normal range of motion.     Cervical back: Normal range of motion.  Neurological:     General: No focal deficit present.     Mental Status: She is alert and oriented to person, place, and time.     Cranial Nerves: No cranial nerve deficit.  Skin:    General: Skin is warm and dry.  Psychiatric:        Mood and Affect: Mood normal.        Behavior: Behavior normal.        Thought Content: Thought content normal.        Judgment: Judgment normal.  Vitals reviewed.     Assessment/Plan:  Encounter for annual routine gynecological examination  Encounter for screening mammogram for malignant neoplasm of breast; pt current on mammo  Family history of breast cancer--MyRisk testing discussed and declined, handout given. F/u prn.  Vaginal atrophy - Plan: estradiol (ESTRACE) 0.1 MG/GM vaginal cream; Rx RF estrace crm.  Vasomotor symptoms due to menopause - Plan: Estradiol-Norethindrone Acet 0.5-0.1 MG tablet; HRT discussed. Will try activella. Rx eRxd. F/u prn.    Meds ordered this encounter  Medications  . Estradiol-Norethindrone Acet 0.5-0.1 MG tablet    Sig: Take 1 tablet by mouth daily.    Dispense:  90 tablet    Refill:  3    Order Specific Question:   Supervising Provider    Answer:   Gae Dry [id number]  . estradiol (ESTRACE) 0.1 MG/GM vaginal cream    Sig: Insert 1g  once weekly as maintenace    Dispense:  42.5 g    Refill:  1    Order Specific Question:   Supervising Provider     Answer:   Gae Dry [id number]            GYN counsel breast self exam, mammography screening, use and side effects of HRT, menopause, adequate intake of calcium and vitamin D, diet and exercise    F/U  Return in about 1 year (around 05/13/2020).  Brennyn Haisley B. Martel Galvan, PA-C 05/14/2019 4:43 PM

## 2019-07-29 ENCOUNTER — Other Ambulatory Visit: Payer: Self-pay | Admitting: Physician Assistant

## 2019-09-29 ENCOUNTER — Encounter: Payer: Self-pay | Admitting: Physician Assistant

## 2019-09-29 ENCOUNTER — Ambulatory Visit: Payer: Managed Care, Other (non HMO) | Admitting: Physician Assistant

## 2019-09-29 ENCOUNTER — Other Ambulatory Visit (HOSPITAL_COMMUNITY)
Admission: RE | Admit: 2019-09-29 | Discharge: 2019-09-29 | Disposition: A | Discharge status: Home / Self Care | Payer: Managed Care, Other (non HMO) | Source: Ambulatory Visit | Attending: Physician Assistant | Admitting: Physician Assistant

## 2019-09-29 ENCOUNTER — Other Ambulatory Visit: Payer: Self-pay

## 2019-09-29 VITALS — BP 118/74 | HR 80 | Temp 96.2°F | Wt 147.6 lb

## 2019-09-29 DIAGNOSIS — N898 Other specified noninflammatory disorders of vagina: Secondary | ICD-10-CM | POA: Insufficient documentation

## 2019-09-29 MED ORDER — METRONIDAZOLE 500 MG PO TABS: 500.0000 mg | ORAL_TABLET | Freq: Two times a day (BID) | ORAL | 0 refills | Status: AC

## 2019-09-29 MED ORDER — TERCONAZOLE 0.4 % VA CREA: 1.0000 | TOPICAL_CREAM | Freq: Every day | VAGINAL | 0 refills | Status: DC

## 2019-09-29 NOTE — Patient Instructions (Signed)

## 2019-09-29 NOTE — Progress Notes (Signed)
Established patient visit   Patient: Rose Marsh   DOB: [birth year]   54 y.o. Female  MRN: [id number] Visit Date: 09/29/2019  Today's healthcare provider: Trinna Post, PA-C   Chief Complaint  Patient presents with  . Vaginal Itching  I,Porsha C McClurkin,acting as a scribe for Trinna Post, PA-C.,have documented all relevant documentation on the behalf of Trinna Post, PA-C,as directed by  Trinna Post, PA-C while in the presence of Trinna Post, PA-C.  Subjective    Vaginal Itching The patient's primary symptoms include genital itching and vaginal discharge. The patient's pertinent negatives include no genital odor, pelvic pain or vaginal bleeding. Primary symptoms comment: burn. This is a new problem. The current episode started 1 to 4 weeks ago. The problem occurs constantly. The pain is moderate. Associated symptoms include frequency. Pertinent negatives include no abdominal pain, back pain, discolored urine, dysuria, flank pain, nausea, rash, urgency or vomiting. The vaginal discharge was clear. There has been no bleeding. She has not been passing clots. She has not been passing tissue. Nothing aggravates the symptoms.  Patient reports she had taking Diflucan on Sunday,09/26/2019 from urgent care and its not getting any better. She had shaved recently. She is having more discharge than normal. Seen by urgent care and was given the diflucan.        Medications: Outpatient Medications Prior to Visit  Medication Sig  . amLODipine (NORVASC) 10 MG tablet Take 10 mg by mouth daily.  Marland Kitchen estradiol (ESTRACE) 0.1 MG/GM vaginal cream Insert 1g  once weekly as maintenace  . Estradiol-Norethindrone Acet 0.5-0.1 MG tablet Take 1 tablet by mouth daily.  . ferrous sulfate 325 (65 FE) MG tablet Take by mouth.  . hydrOXYzine (ATARAX/VISTARIL) 10 MG tablet TAKE 1 TABLET (10 MG TOTAL) BY MOUTH AS DIRECTED (PRN FOR INSOMNIA)  . pyridOXINE (VITAMIN B-6) 25 MG tablet Take  by mouth.  . cetirizine (ZYRTEC) 10 MG tablet Take 1 tablet (10 mg total) by mouth daily.   No facility-administered medications prior to visit.    Review of Systems  Gastrointestinal: Negative for abdominal pain, nausea and vomiting.  Genitourinary: Positive for frequency and vaginal discharge. Negative for dysuria, flank pain, pelvic pain and urgency.  Musculoskeletal: Negative for back pain.  Skin: Negative for rash.       Objective    BP 118/74 (BP Location: Left Arm, Patient Position: Sitting, Cuff Size: Normal)   Pulse 80   Temp (!) 96.2 F (35.7 C) (Temporal)   Wt 147 lb 9.6 oz (67 kg)   SpO2 95%   BMI 23.12 kg/m     Physical Exam Constitutional:      Appearance: Normal appearance.  Genitourinary:    Vagina: Vaginal discharge present.  Skin:    General: Skin is warm and dry.  Neurological:     Mental Status: She is alert and oriented to person, place, and time. Mental status is at baseline.  Psychiatric:        Mood and Affect: Mood normal.        Behavior: Behavior normal.       No results found for any visits on 09/29/19.  Assessment & Plan    1. Vaginal discharge  Will send in flagyl in case results come back positive for BV over the weekend. Send off vaginal swab.  - metroNIDAZOLE (FLAGYL) 500 MG tablet; Take 1 tablet (500 mg total) by mouth 2 (two) times daily for 7 days.  Dispense: 14  tablet; Refill: 0  2. Vaginal itching  - terconazole (TERAZOL 7) 0.4 % vaginal cream; Place 1 applicator vaginally at bedtime.  Dispense: 45 g; Refill: 0 - metroNIDAZOLE (FLAGYL) 500 MG tablet; Take 1 tablet (500 mg total) by mouth 2 (two) times daily for 7 days.  Dispense: 14 tablet; Refill: 0    No follow-ups on file.      ITrinna Post, PA-C, have reviewed all documentation for this visit. The documentation on 09/29/19 for the exam, diagnosis, procedures, and orders are all accurate and complete.    Paulene Floor  [facility] [phone] (phone) [phone] (fax)  [place]

## 2019-09-30 LAB — CERVICOVAGINAL ANCILLARY ONLY
Bacterial Vaginitis (gardnerella): NEGATIVE
Candida Glabrata: NEGATIVE
Candida Vaginitis: NEGATIVE
Chlamydia: NEGATIVE
Comment: NEGATIVE
Comment: NEGATIVE
Comment: NEGATIVE
Comment: NEGATIVE
Comment: NEGATIVE
Comment: NORMAL
Neisseria Gonorrhea: NEGATIVE
Trichomonas: NEGATIVE

## 2019-10-06 ENCOUNTER — Encounter: Payer: Self-pay | Admitting: Physician Assistant

## 2019-10-21 NOTE — Progress Notes (Signed)
Rose Post, PA-C   Chief Complaint  Patient presents with   Vaginal Itching    light discharge, irritation, no odor x 1 month   Urinary Tract Infection    frequency and burning urinating, pelvic and back pain, no blood x 1 month    HPI:      [name] whose LMP was No LMP recorded. (Menstrual status: Perimenopausal)., presents today for increased vag itching/irritation for over a month, particularly along the hair line bilat labia majora, Slightly increased d/c without fishy odor. Has been treated with diflucan and terazol without relief.  Hx of BV in past. Pt uses dove sens skin soap and dryer sheets.  Has also had urinary frequency with good flow and dysuria. Unsure if burning is due to vaginal irritation or from UTI. No recent hx of UTIs.   Pt also with RT hip pain, RT low back pain and RLQ pain for several wks. Feels like a burning sensation and has weakness in RT leg. Had similar sx in past when had RTO cyst and s/p cystectomy. Has upcoming appt next wk with PCP.   She was started on oral HRT for vasomotor sx at 1/21 annual. Has not had much improvement. Would like to wean off. Also has 7 day period about 2 months after starting HRT. Had mod flow, no dysmen. Had been amenorrheic for over a yr. No recent vag bleeding.   She is sex active, no postcoital bleeding. Hasn't been since vag sx. Does have a new partner. Using estrace crm with sx relief.    Past Medical History:  Diagnosis Date   Hypertension    No known health problems     Past Surgical History:  Procedure Laterality Date   LAPAROSCOPIC OVARIAN CYSTECTOMY Right     Family History  Problem Relation Age of Onset   Arthritis Mother    Rheum arthritis Mother    Brain cancer Father    Stroke Father    Breast cancer Sister        80s   Bone cancer Paternal Grandmother     Social History   Socioeconomic History   Marital status: Single    Spouse name: Not  on file   Number of children: Not on file   Years of education: Not on file   Highest education level: Not on file  Occupational History   Not on file  Tobacco Use   Smoking status: Never Smoker   Smokeless tobacco: Never Used  Vaping Use   Vaping Use: Never used  Substance and Sexual Activity   Alcohol use: Yes    Comment: infrequently   Drug use: Never   Sexual activity: Yes    Birth control/protection: None  Other Topics Concern   Not on file  Social History Narrative   Not on file   Social Determinants of Health   Financial Resource Strain:    Difficulty of Paying Living Expenses:   Food Insecurity:    Worried About Charity fundraiser in the Last Year:    Arboriculturist in the Last Year:   Transportation Needs:    Film/video editor (Medical):    Lack of Transportation (Non-Medical):   Physical Activity:    Days of Exercise per Week:    Minutes of Exercise per Session:   Stress:    Feeling of Stress :   Social Connections:    Frequency of Communication with Friends  and Family:    Frequency of Social Gatherings with Friends and Family:    Attends Religious Services:    Active Member of Clubs or Organizations:    Attends Music therapist:    Marital Status:   Intimate Partner Violence:    Fear of Current or Ex-Partner:    Emotionally Abused:    Physically Abused:    Sexually Abused:     Outpatient Medications Prior to Visit  Medication Sig Dispense Refill   amLODipine (NORVASC) 5 MG tablet Take 5 mg by mouth daily.     estradiol (ESTRACE) 0.1 MG/GM vaginal cream Insert 1g  once weekly as maintenace 42.5 g 1   hydrOXYzine (ATARAX/VISTARIL) 10 MG tablet TAKE 1 TABLET (10 MG TOTAL) BY MOUTH AS DIRECTED (PRN FOR INSOMNIA)     terconazole (TERAZOL 7) 0.4 % vaginal cream Place 1 applicator vaginally at bedtime. 45 g 0   cetirizine (ZYRTEC) 10 MG tablet Take 1 tablet (10 mg total) by mouth daily. 90 tablet 1    amLODipine (NORVASC) 10 MG tablet Take 10 mg by mouth daily.     Estradiol-Norethindrone Acet 0.5-0.1 MG tablet Take 1 tablet by mouth daily. 90 tablet 3   ferrous sulfate 325 (65 FE) MG tablet Take by mouth.     pyridOXINE (VITAMIN B-6) 25 MG tablet Take by mouth.     No facility-administered medications prior to visit.      ROS:  Review of Systems  Constitutional: Negative for fever.  Gastrointestinal: Negative for blood in stool, constipation, diarrhea, nausea and vomiting.  Genitourinary: Positive for dysuria, frequency, pelvic pain and vaginal discharge. Negative for dyspareunia, flank pain, hematuria, urgency, vaginal bleeding and vaginal pain.  Musculoskeletal: Positive for back pain.  Skin: Negative for rash.  Neurological: Positive for weakness.    OBJECTIVE:   Vitals:  BP 120/80    Ht 5\' 7"  (1.702 m)    Wt 152 lb (68.9 kg)    BMI 23.81 kg/m   Physical Exam Vitals reviewed.  Constitutional:      Appearance: She is well-developed.  Pulmonary:     Effort: Pulmonary effort is normal.  Abdominal:     Palpations: Abdomen is soft.     Tenderness: There is abdominal tenderness in the right lower quadrant. There is no guarding or rebound.  Genitourinary:    General: Normal vulva.     Pubic Area: No rash.      Labia:        Right: No rash, tenderness or lesion.        Left: No rash, tenderness or lesion.      Vagina: Normal. No vaginal discharge, erythema or tenderness.     Cervix: Normal.     Uterus: Normal. Not enlarged and not tender.      Adnexa: Left adnexa normal.       Right: Tenderness present. No mass.         Left: No mass or tenderness.       Comments: NO VULVAR ERYTHEMA, EXCORIATIONS, RASH Musculoskeletal:        General: Normal range of motion.       Arms:     Cervical back: Normal range of motion.  Skin:    General: Skin is warm and dry.  Neurological:     General: No focal deficit present.     Mental Status: She is alert and oriented to  person, place, and time.  Psychiatric:        Mood  and Affect: Mood normal.        Behavior: Behavior normal.        Thought Content: Thought content normal.        Judgment: Judgment normal.     Results: Results for orders placed or performed in visit on 10/22/19 (from the past 24 hour(s))  POCT Wet Prep with KOH     Status: Normal   Collection Time: 10/22/19  2:38 PM  Result Value Ref Range   Trichomonas, UA Negative    Clue Cells Wet Prep HPF POC neg    Epithelial Wet Prep HPF POC     Yeast Wet Prep HPF POC neg    Bacteria Wet Prep HPF POC     RBC Wet Prep HPF POC     WBC Wet Prep HPF POC     KOH Prep POC Negative Negative  POCT Urinalysis Dipstick     Status: Normal   Collection Time: 10/22/19  2:39 PM  Result Value Ref Range   Color, UA yellow    Clarity, UA clear    Glucose, UA Negative Negative   Bilirubin, UA neg    Ketones, UA neg    Spec Grav, UA 1.010 1.010 - 1.025   Blood, UA neg    pH, UA 6.0 5.0 - 8.0   Protein, UA Negative Negative   Urobilinogen, UA     Nitrite, UA neg    Leukocytes, UA Negative Negative   Appearance     Odor       Assessment/Plan: Vaginal itching - Plan: POCT Wet Prep with KOH, NuSwab Vaginitis Plus (VG+); Neg exam/wet prep. Check nuswab. lready treated with yeast meds. Will f/u with results. If neg, question chem derm. Line dry underwear/no dryer sheets, change to unscented detergent.  Screening for STD (sexually transmitted disease) - Plan: NuSwab Vaginitis Plus (VG+)  UTI symptoms - Plan: POCT Urinalysis Dipstick, Urine Culture, Neg UA. Check C&S. Will f/u if pos.   RLQ abdominal pain - Plan: US PELVIS TRANSVAGINAL NON-OB (TV ONLY); RLQ pain, rule out RTO path. Hx of ovar cyst in past.   PMB (postmenopausal bleeding)--1 episode on HRT. Check GYN u/s anyway. No sx since. Reassurance if u/s neg. Also considering weaning off anyway.      Return in about 1 day (around 10/23/2019) for GYN u/s RLQ pain--[facility] to call pt.  Kierre Deines B.  Christia Coaxum, PA-C 10/22/2019 2:43 PM

## 2019-10-22 ENCOUNTER — Ambulatory Visit: Payer: Managed Care, Other (non HMO) | Admitting: Obstetrics and Gynecology

## 2019-10-22 ENCOUNTER — Encounter: Payer: Self-pay | Admitting: Obstetrics and Gynecology

## 2019-10-22 ENCOUNTER — Other Ambulatory Visit: Payer: Self-pay

## 2019-10-22 VITALS — BP 120/80 | Ht 67.0 in | Wt 152.0 lb

## 2019-10-22 DIAGNOSIS — N950 Postmenopausal bleeding: Secondary | ICD-10-CM

## 2019-10-22 DIAGNOSIS — R1031 Right lower quadrant pain: Secondary | ICD-10-CM | POA: Diagnosis not present

## 2019-10-22 DIAGNOSIS — R399 Unspecified symptoms and signs involving the genitourinary system: Secondary | ICD-10-CM | POA: Diagnosis not present

## 2019-10-22 DIAGNOSIS — Z113 Encounter for screening for infections with a predominantly sexual mode of transmission: Secondary | ICD-10-CM | POA: Diagnosis not present

## 2019-10-22 DIAGNOSIS — N898 Other specified noninflammatory disorders of vagina: Secondary | ICD-10-CM | POA: Diagnosis not present

## 2019-10-22 LAB — POCT URINALYSIS DIPSTICK
Bilirubin, UA: NEGATIVE
Blood, UA: NEGATIVE
Glucose, UA: NEGATIVE
Ketones, UA: NEGATIVE
Leukocytes, UA: NEGATIVE
Nitrite, UA: NEGATIVE
Protein, UA: NEGATIVE
Spec Grav, UA: 1.01 (ref 1.010–1.025)
pH, UA: 6 (ref 5.0–8.0)

## 2019-10-22 LAB — POCT WET PREP WITH KOH
Clue Cells Wet Prep HPF POC: NEGATIVE
KOH Prep POC: NEGATIVE
Trichomonas, UA: NEGATIVE
Yeast Wet Prep HPF POC: NEGATIVE

## 2019-10-22 NOTE — Patient Instructions (Signed)
I value your feedback and entrusting us with your care. If you get a [facility] patient survey, I would appreciate you taking the time to let us know about your experience today. Thank you!  As of April 15, 2019, your lab results will be released to your MyChart immediately, before I even have a chance to see them. Please give me time to review them and contact you if there are any abnormalities. Thank you for your patience.  

## 2019-10-24 LAB — URINE CULTURE: Organism ID, Bacteria: NO GROWTH

## 2019-10-25 ENCOUNTER — Ambulatory Visit (INDEPENDENT_AMBULATORY_CARE_PROVIDER_SITE_OTHER): Payer: Managed Care, Other (non HMO)

## 2019-10-25 ENCOUNTER — Other Ambulatory Visit: Payer: Self-pay

## 2019-10-25 DIAGNOSIS — R1031 Right lower quadrant pain: Secondary | ICD-10-CM

## 2019-10-26 LAB — NUSWAB VAGINITIS PLUS (VG+)
Candida albicans, NAA: NEGATIVE
Candida glabrata, NAA: NEGATIVE
Chlamydia trachomatis, NAA: NEGATIVE
Neisseria gonorrhoeae, NAA: NEGATIVE
Trich vag by NAA: NEGATIVE

## 2019-10-28 NOTE — Progress Notes (Signed)
     Established patient visit   Patient: Rose Marsh   DOB: Oct 07, 1965   54 y.o. Female  MRN: [id number] Visit Date: 10/29/2019  Today's healthcare provider: Trinna Post, PA-C   Chief Complaint  Patient presents with  . Hip Pain   Subjective    HPI  Patient comes in today c/o right hip pain.The pain radiates down her right leg. She has had the pain for about 6 months. She describes it as a burning sensation. She denies any injuries. Nothing makes the pain feel better. Sitting for long periods of time makes it feel worse. She reports that she has had symptoms before in the past, but it has been a few years.        Medications: Outpatient Medications Prior to Visit  Medication Sig  . amLODipine (NORVASC) 5 MG tablet Take 5 mg by mouth daily.  Marland Kitchen estradiol (ESTRACE) 0.1 MG/GM vaginal cream Insert 1g  once weekly as maintenace  . hydrOXYzine (ATARAX/VISTARIL) 10 MG tablet TAKE 1 TABLET (10 MG TOTAL) BY MOUTH AS DIRECTED (PRN FOR INSOMNIA)  . terconazole (TERAZOL 7) 0.4 % vaginal cream Place 1 applicator vaginally at bedtime.  . cetirizine (ZYRTEC) 10 MG tablet Take 1 tablet (10 mg total) by mouth daily.   No facility-administered medications prior to visit.    Review of Systems  Cardiovascular: Negative for leg swelling.  Musculoskeletal: Positive for arthralgias and myalgias.  Neurological: Positive for weakness and numbness.        Objective    BP 128/84   Pulse 72   Temp 97.9 F (36.6 C)   Resp 16   Ht 5\' 7"  (1.702 m)   BMI 23.81 kg/m    Physical Exam Constitutional:      Appearance: Normal appearance.  Musculoskeletal:        General: Normal range of motion.     Right hip: Normal.     Left hip: Normal.  Skin:    General: Skin is warm and dry.  Neurological:     Mental Status: She is alert and oriented to person, place, and time. Mental status is at baseline.  Psychiatric:        Mood and Affect: Mood normal.        Behavior: Behavior normal.        No results found for any visits on 10/29/19.  Assessment & Plan    1. Right hip pain Most likely arthritis. Discussed treatments including Tylenol, NSAIDS, steroid injections, and orthopedic referral.  - DG HIP UNILAT WITH PELVIS 2-3 VIEWS RIGHT; Future  2. Left hip pain  - DG HIP UNILAT WITH PELVIS 2-3 VIEWS LEFT; Future  3. Low back pain, unspecified back pain laterality, unspecified chronicity, unspecified whether sciatica present  - DG Lumbar Spine Complete; Future   Return if symptoms worsen or fail to improve.      ITrinna Post, PA-C, have reviewed all documentation for this visit. The documentation on 11/02/19 for the exam, diagnosis, procedures, and orders are all accurate and complete.    Paulene Floor  [facility] [phone] (phone) [phone] (fax)  [place]

## 2019-10-29 ENCOUNTER — Encounter: Payer: Self-pay | Admitting: Physician Assistant

## 2019-10-29 ENCOUNTER — Other Ambulatory Visit: Payer: Self-pay

## 2019-10-29 ENCOUNTER — Ambulatory Visit (INDEPENDENT_AMBULATORY_CARE_PROVIDER_SITE_OTHER): Payer: Managed Care, Other (non HMO) | Admitting: Physician Assistant

## 2019-10-29 VITALS — BP 128/84 | HR 72 | Temp 97.9°F | Resp 16 | Ht 67.0 in

## 2019-10-29 DIAGNOSIS — M545 Low back pain, unspecified: Secondary | ICD-10-CM

## 2019-10-29 DIAGNOSIS — M25551 Pain in right hip: Secondary | ICD-10-CM

## 2019-10-29 DIAGNOSIS — M25552 Pain in left hip: Secondary | ICD-10-CM | POA: Diagnosis not present

## 2019-11-02 NOTE — Patient Instructions (Signed)
Hip Pain The hip is the joint between the upper legs and the lower pelvis. The bones, cartilage, tendons, and muscles of your hip joint support your body and allow you to move around. Hip pain can range from a minor ache to severe pain in one or both of your hips. The pain may be felt on the inside of the hip joint near the groin, or on the outside near the buttocks and upper thigh. You may also have swelling or stiffness in your hip area. Follow these instructions at home: Managing pain, stiffness, and swelling      If directed, put ice on the painful area. To do this: ? Put ice in a plastic bag. ? Place a towel between your skin and the bag. ? Leave the ice on for 20 minutes, 2-3 times a day.  If directed, apply heat to the affected area as often as told by your health care provider. Use the heat source that your health care provider recommends, such as a moist heat pack or a heating pad. ? Place a towel between your skin and the heat source. ? Leave the heat on for 20-30 minutes. ? Remove the heat if your skin turns bright red. This is especially important if you are unable to feel pain, heat, or cold. You may have a greater risk of getting burned. Activity  Do exercises as told by your health care provider.  Avoid activities that cause pain. General instructions   Take over-the-counter and prescription medicines only as told by your health care provider.  Keep a journal of your symptoms. Write down: ? How often you have hip pain. ? The location of your pain. ? What the pain feels like. ? What makes the pain worse.  Sleep with a pillow between your legs on your most comfortable side.  Keep all follow-up visits as told by your health care provider. This is important. Contact a health care provider if:  You cannot put weight on your leg.  Your pain or swelling continues or gets worse after one week.  It gets harder to walk.  You have a fever. Get help right away  if:  You fall.  You have a sudden increase in pain and swelling in your hip.  Your hip is red or swollen or very tender to touch. Summary  Hip pain can range from a minor ache to severe pain in one or both of your hips.  The pain may be felt on the inside of the hip joint near the groin, or on the outside near the buttocks and upper thigh.  Avoid activities that cause pain.  Write down how often you have hip pain, the location of the pain, what makes it worse, and what it feels like. This information is not intended to replace advice given to you by your health care provider. Make sure you discuss any questions you have with your health care provider. Document Revised: 09/07/2018 Document Reviewed: 09/07/2018 Elsevier Patient Education  2020 Elsevier Inc[employer] -- 

## 2019-11-04 ENCOUNTER — Other Ambulatory Visit: Payer: Self-pay

## 2019-11-04 ENCOUNTER — Ambulatory Visit
Admission: RE | Admit: 2019-11-04 | Discharge: 2019-11-04 | Disposition: A | Discharge status: Home / Self Care | Payer: Managed Care, Other (non HMO) | Source: Ambulatory Visit | Attending: Physician Assistant | Admitting: Physician Assistant

## 2019-11-04 ENCOUNTER — Ambulatory Visit
Admission: RE | Admit: 2019-11-04 | Discharge: 2019-11-04 | Disposition: A | Discharge status: Home / Self Care | Payer: Managed Care, Other (non HMO) | Attending: Physician Assistant | Admitting: Physician Assistant

## 2019-11-04 DIAGNOSIS — M25551 Pain in right hip: Secondary | ICD-10-CM

## 2019-11-04 DIAGNOSIS — M25552 Pain in left hip: Secondary | ICD-10-CM

## 2019-11-04 DIAGNOSIS — M545 Low back pain, unspecified: Secondary | ICD-10-CM

## 2019-11-28 ENCOUNTER — Other Ambulatory Visit: Payer: Self-pay | Admitting: Physician Assistant

## 2019-11-28 DIAGNOSIS — I10 Essential (primary) hypertension: Secondary | ICD-10-CM

## 2019-11-28 NOTE — Telephone Encounter (Signed)
Requested medications are due for refill today?  Uncertain - medication listed as a historical medication.    Requested medications are on active medication list?  Listed as historical medication.    Last Refill:   Uncertain.    Future visit scheduled?  No  Notes to Clinic:  Unable to refill historical medications.

## 2019-11-29 NOTE — Telephone Encounter (Signed)
[name] for blood pressure was on 03/19/2019 and no upcoming visit. Called patient to schedule a HTN follow-up and she scheduled one for 12/31/2019 @ 1:40 PM. Medication was send into the pharmacy.

## 2019-12-27 ENCOUNTER — Other Ambulatory Visit: Payer: Self-pay | Admitting: Physician Assistant

## 2019-12-27 DIAGNOSIS — J301 Allergic rhinitis due to pollen: Secondary | ICD-10-CM

## 2019-12-31 ENCOUNTER — Ambulatory Visit: Payer: Self-pay | Admitting: Physician Assistant

## 2020-05-02 NOTE — Progress Notes (Signed)
Complete physical exam   Patient: Rose Marsh   DOB: Mar 23, 1966   54 y.o. Female  MRN: [id number] Visit Date: 05/03/2020  Today's healthcare provider: Trinna Post, PA-C   Chief Complaint  Patient presents with  . Annual Exam  . Vaginal Discharge   Subjective    Rose Marsh is a 54 y.o. female who presents today for a complete physical exam.  She reports consuming a general diet. The patient does not participate in regular exercise at present. She generally feels well. She reports sleeping well. She does have additional problems to discuss today.  Vaginal Discharge The patient's primary symptoms include genital itching and vaginal discharge. This is a recurrent problem. The current episode started more than 1 month ago. The problem has been unchanged. Pertinent negatives include no back pain, chills, dysuria, fever, frequency, hematuria, sore throat or urgency.    Lipid/Cholesterol, Follow-up  Last lipid panel Other pertinent labs  Lab Results  Component Value Date   CHOL 190 01/22/2019   HDL 93 01/22/2019   LDLCALC 88 01/22/2019   TRIG 48 01/22/2019   CHOLHDL 2.0 01/22/2019   Lab Results  Component Value Date   ALT 18 01/22/2019   AST 22 01/22/2019   PLT 254 01/22/2019   TSH 1.530 01/22/2019     She was last seen for this 6 months ago.  Management since that visit includes continue amlodipine 5 mg daily.  She reports poor compliance with treatment. She stopped taking the medication 3 months ago.  Symptoms: No chest pain No chest pressure/discomfort  No dyspnea No lower extremity edema  No numbness or tingling of extremity No orthopnea  No palpitations No paroxysmal nocturnal dyspnea  No speech difficulty No syncope   Current diet: well balanced Current exercise: aerobics  The ASCVD Risk score Mikey Bussing DC Jr., et al., 2013) failed to calculate for the following reasons:   Unable to determine if patient is Non-Hispanic African  American  Vasomotor symptoms: She continues to be bothered by hot flashes. She was previously treated with OCP but stopped this as it did not relieve symptoms. She reports success with prempro but stopped due to family history of cancer. She takes vaginal estrace still.  ---------------------------------------------------------------------------------------------------     Past Medical History:  Diagnosis Date  . BV (bacterial vaginosis)   . Hypertension   . Ovarian cyst   . Vasomotor symptoms due to menopause    Past Surgical History:  Procedure Laterality Date  . LAPAROSCOPIC OVARIAN CYSTECTOMY Right    Social History   Socioeconomic History  . Marital status: Single    Spouse name: Not on file  . Number of children: Not on file  . Years of education: Not on file  . Highest education level: Not on file  Occupational History  . Not on file  Tobacco Use  . Smoking status: Never Smoker  . Smokeless tobacco: Never Used  Vaping Use  . Vaping Use: Never used  Substance and Sexual Activity  . Alcohol use: Yes    Comment: infrequently  . Drug use: Never  . Sexual activity: Yes    Birth control/protection: None  Other Topics Concern  . Not on file  Social History Narrative  . Not on file   Social Determinants of Health   Financial Resource Strain: Not on file  Food Insecurity: Not on file  Transportation Needs: Not on file  Physical Activity: Not on file  Stress: Not on file  Social Connections: Not  on file  Intimate Partner Violence: Not on file   Family Status  Relation Name Status  . Mother  Alive  . Father  Deceased  . Sister  Alive  . PGM  Deceased  . Neg Hx  (Not Specified)   Family History  Problem Relation Age of Onset  . Arthritis Mother   . Rheum arthritis Mother   . Brain cancer Father   . Stroke Father   . Breast cancer Sister        82s  . Bone cancer Paternal Grandmother   . Colon cancer Neg Hx    No Known Allergies  Patient Care  Team: Paulene Floor as PCP - General (Physician Assistant)   Medications: Outpatient Medications Prior to Visit  Medication Sig  . cetirizine (ZYRTEC) 10 MG tablet TAKE 1 TABLET BY MOUTH EVERY DAY  . hydrOXYzine (ATARAX/VISTARIL) 10 MG tablet TAKE 1 TABLET (10 MG TOTAL) BY MOUTH AS DIRECTED (PRN FOR INSOMNIA)  . [DISCONTINUED] amLODipine (NORVASC) 5 MG tablet TAKE 1 TABLET BY MOUTH EVERY DAY (Patient not taking: Reported on 05/03/2020)  . [DISCONTINUED] estradiol (ESTRACE) 0.1 MG/GM vaginal cream Insert 1g  once weekly as maintenace (Patient not taking: Reported on 05/03/2020)  . [DISCONTINUED] terconazole (TERAZOL 7) 0.4 % vaginal cream Place 1 applicator vaginally at bedtime. (Patient not taking: Reported on 05/03/2020)   No facility-administered medications prior to visit.    Review of Systems  Constitutional: Positive for diaphoresis. Negative for activity change, appetite change, chills, fatigue, fever and unexpected weight change.  HENT: Positive for postnasal drip. Negative for congestion, dental problem, drooling, ear discharge, ear pain, facial swelling, hearing loss, mouth sores, nosebleeds, rhinorrhea, sinus pressure, sinus pain, sneezing, sore throat, tinnitus, trouble swallowing and voice change.   Eyes: Negative.   Respiratory: Negative.   Cardiovascular: Negative.   Gastrointestinal: Negative.   Endocrine: Negative.   Genitourinary: Positive for vaginal discharge. Negative for decreased urine volume, difficulty urinating, dysuria, frequency, hematuria, urgency, vaginal bleeding and vaginal pain.  Musculoskeletal: Positive for arthralgias. Negative for back pain, gait problem, joint swelling, myalgias, neck pain and neck stiffness.  Skin: Negative.   Allergic/Immunologic: Negative.   Neurological: Negative.   Hematological: Negative.   Psychiatric/Behavioral: Negative.       Objective    BP 133/78 (BP Location: Right Arm, Patient Position: Sitting, Cuff  Size: Large)   Pulse 82   Temp 97.9 F (36.6 C) (Oral)   Ht 5\' 7"  (1.702 m)   Wt 150 lb (68 kg)   SpO2 100%   BMI 23.49 kg/m    Physical Exam Constitutional:      Appearance: Normal appearance.  HENT:     Right Ear: Tympanic membrane and ear canal normal.     Left Ear: Tympanic membrane and ear canal normal.  Cardiovascular:     Rate and Rhythm: Normal rate and regular rhythm.     Heart sounds: Normal heart sounds.  Pulmonary:     Effort: Pulmonary effort is normal.     Breath sounds: Normal breath sounds.  Abdominal:     General: Bowel sounds are normal.     Palpations: Abdomen is soft.  Genitourinary:    Vagina: Normal.     Cervix: Normal.  Musculoskeletal:     Cervical back: Normal range of motion and neck supple.  Lymphadenopathy:     Cervical: No cervical adenopathy.  Skin:    General: Skin is warm and dry.  Neurological:     Mental Status:  She is alert and oriented to person, place, and time. Mental status is at baseline.  Psychiatric:        Mood and Affect: Mood normal.        Behavior: Behavior normal.       Last depression screening scores PHQ 2/9 Scores 05/03/2020 10/14/2017  PHQ - 2 Score 0 0  PHQ- 9 Score 0 0   Last fall risk screening Fall Risk  05/03/2020  Falls in the past year? 0  Number falls in past yr: 0  Injury with Fall? 0  Follow up Falls evaluation completed   Last Audit-C alcohol use screening Alcohol Use Disorder Test (AUDIT) 05/03/2020  1. How often do you have a drink containing alcohol? 2  2. How many drinks containing alcohol do you have on a typical day when you are drinking? 0  3. How often do you have six or more drinks on one occasion? 0  AUDIT-C Score 2  Alcohol Brief Interventions/Follow-up AUDIT Score <7 follow-up not indicated   A score of 3 or more in women, and 4 or more in men indicates increased risk for alcohol abuse, EXCEPT if all of the points are from question 1   No results found for any visits on  05/03/20.  Assessment & Plan    Routine Health Maintenance and Physical Exam  Exercise Activities and Dietary recommendations Goals   None     Immunization History  Administered Date(s) Administered  . Influenza, Seasonal, Injecte, Preservative Fre 02/04/2016  . Influenza,inj,Quad PF,6+ Mos 02/07/2015, 01/22/2019  . Influenza-Unspecified 01/19/2020  . Moderna Sars-Covid-2 Vaccination 05/05/2019, 06/04/2019, 04/18/2020  . PPD Test 03/29/2013  . Tdap 10/25/2011, 03/29/2013    Health Maintenance  Topic Date Due  . COLONOSCOPY (Pts 45-74yrs Insurance coverage will need to be confirmed)  Never done  . MAMMOGRAM  03/18/2021  . PAP SMEAR-Modifier  05/23/2022  . TETANUS/TDAP  03/30/2023  . INFLUENZA VACCINE  Completed  . COVID-19 Vaccine  Completed  . Hepatitis C Screening  Completed  . HIV Screening  Completed    Discussed health benefits of physical activity, and encouraged her to engage in regular exercise appropriate for her age and condition.  1. Annual physical exam  Requesting colonoscopy records from [facility], performed on 9/28/20215.  - TSH - Lipid panel - Comprehensive metabolic panel - CBC with Differential/Platelet  2. Primary hypertension  Discontinue amlodipine.  3. Vaginal discharge  - Cervicovaginal ancillary only  4. Encounter for screening mammogram for malignant neoplasm of breast  - MM DIGITAL SCREENING BILATERAL  5. Climacteric  Start clonidine as below.  - cloNIDine (CATAPRES) 0.1 MG tablet; Take 1 tablet (0.1 mg total) by mouth daily.  Dispense: 90 tablet; Refill: 1  6. Vaginal itching  - terconazole (TERAZOL 7) 0.4 % vaginal cream; Place 1 applicator vaginally at bedtime.  Dispense: 45 g; Refill: 0 - Cervicovaginal ancillary only  7. Cervical cancer screening  - Cytology - PAP   No follow-ups on file.     ITrey Sailors, PA-C, have reviewed all documentation for this visit. The documentation on 05/03/20 for the exam,  diagnosis, procedures, and orders are all accurate and complete.  The entirety of the information documented in the History of Present Illness, Review of Systems and Physical Exam were personally obtained by me. Portions of this information were initially documented by Kavin Leech, CMA and reviewed by me for thoroughness and accuracy.     Trey Sailors, PA-C  [facility]  [phone] (phone) [phone] (fax)  [place]

## 2020-05-03 ENCOUNTER — Encounter: Payer: Self-pay | Admitting: Physician Assistant

## 2020-05-03 ENCOUNTER — Other Ambulatory Visit: Payer: Self-pay | Admitting: Physician Assistant

## 2020-05-03 ENCOUNTER — Other Ambulatory Visit: Payer: Self-pay

## 2020-05-03 ENCOUNTER — Ambulatory Visit (INDEPENDENT_AMBULATORY_CARE_PROVIDER_SITE_OTHER): Payer: Managed Care, Other (non HMO) | Admitting: Physician Assistant

## 2020-05-03 ENCOUNTER — Other Ambulatory Visit (HOSPITAL_COMMUNITY)
Admission: RE | Admit: 2020-05-03 | Discharge: 2020-05-03 | Disposition: A | Discharge status: Home / Self Care | Payer: Managed Care, Other (non HMO) | Source: Ambulatory Visit | Attending: Physician Assistant | Admitting: Physician Assistant

## 2020-05-03 VITALS — BP 133/78 | HR 82 | Temp 97.9°F | Ht 67.0 in | Wt 150.0 lb

## 2020-05-03 DIAGNOSIS — Z0000 Encounter for general adult medical examination without abnormal findings: Secondary | ICD-10-CM | POA: Diagnosis not present

## 2020-05-03 DIAGNOSIS — N898 Other specified noninflammatory disorders of vagina: Secondary | ICD-10-CM

## 2020-05-03 DIAGNOSIS — Z124 Encounter for screening for malignant neoplasm of cervix: Secondary | ICD-10-CM | POA: Diagnosis present

## 2020-05-03 DIAGNOSIS — N951 Menopausal and female climacteric states: Secondary | ICD-10-CM | POA: Diagnosis not present

## 2020-05-03 DIAGNOSIS — I10 Essential (primary) hypertension: Secondary | ICD-10-CM | POA: Diagnosis not present

## 2020-05-03 DIAGNOSIS — Z1231 Encounter for screening mammogram for malignant neoplasm of breast: Secondary | ICD-10-CM

## 2020-05-03 MED ORDER — TERCONAZOLE 0.4 % VA CREA: 1.0000 | TOPICAL_CREAM | Freq: Every day | VAGINAL | 0 refills | Status: DC

## 2020-05-03 MED ORDER — CLONIDINE HCL 0.1 MG PO TABS: 0.1000 mg | ORAL_TABLET | Freq: Every day | ORAL | 1 refills | Status: DC

## 2020-05-03 NOTE — Patient Instructions (Signed)
Preventive Care 40-56 Years Old, Female °Preventive care refers to visits with your health care provider and lifestyle choices that can promote health and wellness. This includes: °· A yearly physical exam. This may also be called an annual well check. °· Regular dental visits and eye exams. °· Immunizations. °· Screening for certain conditions. °· Healthy lifestyle choices, such as eating a healthy diet, getting regular exercise, not using drugs or products that contain nicotine and tobacco, and limiting alcohol use. °What can I expect for my preventive care visit? °Physical exam °Your health care provider will check your: °· Height and weight. This may be used to calculate body mass index (BMI), which tells if you are at a healthy weight. °· Heart rate and blood pressure. °· Skin for abnormal spots. °Counseling °Your health care provider may ask you questions about your: °· Alcohol, tobacco, and drug use. °· Emotional well-being. °· Home and relationship well-being. °· Sexual activity. °· Eating habits. °· Work and work environment. °· Method of birth control. °· Menstrual cycle. °· Pregnancy history. °What immunizations do I need? ° °Influenza (flu) vaccine °· This is recommended every year. °Tetanus, diphtheria, and pertussis (Tdap) vaccine °· You may need a Td booster every 10 years. °Varicella (chickenpox) vaccine °· You may need this if you have not been vaccinated. °Zoster (shingles) vaccine °· You may need this after age 60. °Measles, mumps, and rubella (MMR) vaccine °· You may need at least one dose of MMR if you were born in 1957 or later. You may also need a second dose. °Pneumococcal conjugate (PCV13) vaccine °· You may need this if you have certain conditions and were not previously vaccinated. °Pneumococcal polysaccharide (PPSV23) vaccine °· You may need one or two doses if you smoke cigarettes or if you have certain conditions. °Meningococcal conjugate (MenACWY) vaccine °· You may need this if you  have certain conditions. °Hepatitis A vaccine °· You may need this if you have certain conditions or if you travel or work in places where you may be exposed to hepatitis A. °Hepatitis B vaccine °· You may need this if you have certain conditions or if you travel or work in places where you may be exposed to hepatitis B. °Haemophilus influenzae type b (Hib) vaccine °· You may need this if you have certain conditions. °Human papillomavirus (HPV) vaccine °· If recommended by your health care provider, you may need three doses over 6 months. °You may receive vaccines as individual doses or as more than one vaccine together in one shot (combination vaccines). Talk with your health care provider about the risks and benefits of combination vaccines. °What tests do I need? °Blood tests °· Lipid and cholesterol levels. These may be checked every 5 years, or more frequently if you are over 50 years old. °· Hepatitis C test. °· Hepatitis B test. °Screening °· Lung cancer screening. You may have this screening every year starting at age 55 if you have a 30-pack-year history of smoking and currently smoke or have quit within the past 15 years. °· Colorectal cancer screening. All adults should have this screening starting at age 50 and continuing until age 75. Your health care provider may recommend screening at age 45 if you are at increased risk. You will have tests every 1-10 years, depending on your results and the type of screening test. °· Diabetes screening. This is done by checking your blood sugar (glucose) after you have not eaten for a while (fasting). You may have this   done every 1-3 years.  Mammogram. This may be done every 1-2 years. Talk with your health care provider about when you should start having regular mammograms. This may depend on whether you have a family history of breast cancer.  BRCA-related cancer screening. This may be done if you have a family history of breast, ovarian, tubal, or peritoneal  cancers.  Pelvic exam and Pap test. This may be done every 3 years starting at age 76. Starting at age 89, this may be done every 5 years if you have a Pap test in combination with an HPV test. Other tests  Sexually transmitted disease (STD) testing.  Bone density scan. This is done to screen for osteoporosis. You may have this scan if you are at high risk for osteoporosis. Follow these instructions at home: Eating and drinking  Eat a diet that includes fresh fruits and vegetables, whole grains, lean protein, and low-fat dairy.  Take vitamin and mineral supplements as recommended by your health care provider.  Do not drink alcohol if: ? Your health care provider tells you not to drink. ? You are pregnant, may be pregnant, or are planning to become pregnant.  If you drink alcohol: ? Limit how much you have to 0-1 drink a day. ? Be aware of how much alcohol is in your drink. In the [place] one drink equals one 12 oz bottle of beer (355 mL), one 5 oz glass of wine (148 mL), or one 1 oz glass of hard liquor (44 mL). Lifestyle  Take daily care of your teeth and gums.  Stay active. Exercise for at least 30 minutes on 5 or more days each week.  Do not use any products that contain nicotine or tobacco, such as cigarettes, e-cigarettes, and chewing tobacco. If you need help quitting, ask your health care provider.  If you are sexually active, practice safe sex. Use a condom or other form of birth control (contraception) in order to prevent pregnancy and STIs (sexually transmitted infections).  If told by your health care provider, take low-dose aspirin daily starting at age 37. What's next?  Visit your health care provider once a year for a well check visit.  Ask your health care provider how often you should have your eyes and teeth checked.  Stay up to date on all vaccines. This information is not intended to replace advice given to you by your health care provider. Make sure you  discuss any questions you have with your health care provider. Document Revised: 01/01/2018 Document Reviewed: 01/01/2018 Elsevier Patient Education  2020 [employer].

## 2020-05-04 LAB — CBC WITH DIFFERENTIAL/PLATELET
Basophils Absolute: 0.1 10*3/uL (ref 0.0–0.2)
Basos: 1 %
EOS (ABSOLUTE): 0 10*3/uL (ref 0.0–0.4)
Eos: 1 %
Hematocrit: 43.4 % (ref 34.0–46.6)
Hemoglobin: 14.7 g/dL (ref 11.1–15.9)
Immature Grans (Abs): 0 10*3/uL (ref 0.0–0.1)
Immature Granulocytes: 0 %
Lymphocytes Absolute: 2.5 10*3/uL (ref 0.7–3.1)
Lymphs: 38 %
MCH: 27.7 pg (ref 26.6–33.0)
MCHC: 33.9 g/dL (ref 31.5–35.7)
MCV: 82 fL (ref 79–97)
Monocytes Absolute: 0.4 10*3/uL (ref 0.1–0.9)
Monocytes: 6 %
Neutrophils Absolute: 3.5 10*3/uL (ref 1.4–7.0)
Neutrophils: 54 %
Platelets: 221 10*3/uL (ref 150–450)
RBC: 5.3 x10E6/uL — ABNORMAL HIGH (ref 3.77–5.28)
RDW: 14.5 % (ref 11.7–15.4)
WBC: 6.5 10*3/uL (ref 3.4–10.8)

## 2020-05-04 LAB — TSH: TSH: 1.21 u[IU]/mL (ref 0.450–4.500)

## 2020-05-04 LAB — COMPREHENSIVE METABOLIC PANEL
ALT: 22 IU/L (ref 0–32)
AST: 22 IU/L (ref 0–40)
Albumin/Globulin Ratio: 2.1 (ref 1.2–2.2)
Albumin: 4.7 g/dL (ref 3.8–4.9)
Alkaline Phosphatase: 63 IU/L (ref 44–121)
BUN/Creatinine Ratio: 18 (ref 9–23)
BUN: 15 mg/dL (ref 6–24)
Bilirubin Total: 0.2 mg/dL (ref 0.0–1.2)
CO2: 24 mmol/L (ref 20–29)
Calcium: 9.8 mg/dL (ref 8.7–10.2)
Chloride: 105 mmol/L (ref 96–106)
Creatinine, Ser: 0.84 mg/dL (ref 0.57–1.00)
GFR calc Af Amer: 90 mL/min/{1.73_m2} (ref >59)
GFR calc non Af Amer: 78 mL/min/{1.73_m2} (ref >59)
Globulin, Total: 2.2 g/dL (ref 1.5–4.5)
Glucose: 81 mg/dL (ref 65–99)
Potassium: 4 mmol/L (ref 3.5–5.2)
Sodium: 145 mmol/L — ABNORMAL HIGH (ref 134–144)
Total Protein: 6.9 g/dL (ref 6.0–8.5)

## 2020-05-04 LAB — LIPID PANEL
Chol/HDL Ratio: 2.1 ratio (ref 0.0–4.4)
Cholesterol, Total: 204 mg/dL — ABNORMAL HIGH (ref 100–199)
HDL: 96 mg/dL (ref >39)
LDL Chol Calc (NIH): 95 mg/dL (ref 0–99)
Triglycerides: 76 mg/dL (ref 0–149)
VLDL Cholesterol Cal: 13 mg/dL (ref 5–40)

## 2020-05-07 LAB — CERVICOVAGINAL ANCILLARY ONLY
Bacterial Vaginitis (gardnerella): NEGATIVE
Chlamydia: NEGATIVE
Comment: NEGATIVE
Comment: NEGATIVE
Comment: NEGATIVE
Comment: NORMAL
Neisseria Gonorrhea: NEGATIVE
Trichomonas: NEGATIVE

## 2020-05-08 LAB — CYTOLOGY - PAP
Comment: NEGATIVE
Diagnosis: NEGATIVE
High risk HPV: NEGATIVE

## 2020-06-11 ENCOUNTER — Encounter: Payer: Self-pay | Admitting: Physician Assistant

## 2020-06-12 ENCOUNTER — Other Ambulatory Visit: Payer: Self-pay

## 2020-06-12 DIAGNOSIS — J301 Allergic rhinitis due to pollen: Secondary | ICD-10-CM

## 2020-06-12 DIAGNOSIS — N951 Menopausal and female climacteric states: Secondary | ICD-10-CM

## 2020-06-12 NOTE — Telephone Encounter (Signed)
Patient send a MyChart message requesting a refill for hydroxyzine 10 MG but it never was filled by you. Is it okay to refill the medication?

## 2020-06-13 MED ORDER — CETIRIZINE HCL 10 MG PO TABS: 10.0000 mg | ORAL_TABLET | Freq: Every day | ORAL | 1 refills | Status: DC

## 2020-06-13 MED ORDER — CLONIDINE HCL 0.1 MG PO TABS: 0.1000 mg | ORAL_TABLET | Freq: Every day | ORAL | 1 refills | Status: DC

## 2020-06-13 MED ORDER — HYDROXYZINE HCL 10 MG PO TABS: ORAL_TABLET | ORAL | 0 refills | Status: DC

## 2020-07-05 ENCOUNTER — Other Ambulatory Visit: Payer: Self-pay | Admitting: Physician Assistant

## 2020-07-05 NOTE — Telephone Encounter (Signed)
   Notes to clinic: Patient requesting a 90 day supply Please review for change    Requested Prescriptions  Pending Prescriptions Disp Refills   hydrOXYzine (ATARAX/VISTARIL) 10 MG tablet [Pharmacy Med Name: HYDROXYZINE HCL 10 MG TABLET] 90 tablet 1    Sig: TAKE 1 TABLET BY MOUTH AS DIRECTED AS NEEDED FOR INSOMNIA      Ear, Nose, and Throat:  Antihistamines Passed - 07/05/2020  1:32 PM      Passed - Valid encounter within last 12 months    Recent Outpatient Visits           2 months ago Annual physical exam   [place], [place], [place]   8 months ago Right hip pain   [facility] Carles Collet M, [place]   9 months ago Vaginal discharge   [facility], Wendee Beavers, [place]   1 year ago Essential hypertension   [place], Wendee Beavers, [place]   1 year ago Essential hypertension   [facility] Trinna Post, [place]       Future Appointments             In 10 months Trinna Post, PA-C [employer], [place]

## 2020-07-07 ENCOUNTER — Other Ambulatory Visit: Payer: Self-pay | Admitting: Physician Assistant

## 2020-07-07 DIAGNOSIS — Z1231 Encounter for screening mammogram for malignant neoplasm of breast: Secondary | ICD-10-CM

## 2020-07-28 ENCOUNTER — Other Ambulatory Visit: Payer: Self-pay

## 2020-07-28 ENCOUNTER — Ambulatory Visit
Admission: RE | Admit: 2020-07-28 | Discharge: 2020-07-28 | Disposition: A | Discharge status: Home / Self Care | Payer: Managed Care, Other (non HMO) | Source: Ambulatory Visit | Attending: Physician Assistant | Admitting: Physician Assistant

## 2020-07-28 DIAGNOSIS — Z1231 Encounter for screening mammogram for malignant neoplasm of breast: Secondary | ICD-10-CM | POA: Insufficient documentation

## 2020-10-09 ENCOUNTER — Other Ambulatory Visit: Payer: Self-pay | Admitting: Family Medicine

## 2020-10-09 MED ORDER — HYDROXYZINE HCL 10 MG PO TABS: ORAL_TABLET | ORAL | 3 refills | Status: DC

## 2020-10-09 NOTE — Telephone Encounter (Signed)
[facility] faxed refill request for the following medications:  hydrOXYzine (ATARAX/VISTARIL) 10 MG tablet  Last Rx: 07/05/20 Qty: 90 Refills: 0 LOV: 05/03/20 Please advise. Thanks TNP

## 2020-12-08 ENCOUNTER — Other Ambulatory Visit: Payer: Self-pay

## 2020-12-08 ENCOUNTER — Encounter: Payer: Self-pay | Admitting: Family Medicine

## 2020-12-08 ENCOUNTER — Ambulatory Visit: Payer: Managed Care, Other (non HMO) | Admitting: Family Medicine

## 2020-12-08 VITALS — BP 138/72 | HR 74 | Resp 16 | Ht 67.0 in | Wt 153.0 lb

## 2020-12-08 DIAGNOSIS — N951 Menopausal and female climacteric states: Secondary | ICD-10-CM

## 2020-12-08 DIAGNOSIS — I10 Essential (primary) hypertension: Secondary | ICD-10-CM | POA: Diagnosis not present

## 2020-12-08 MED ORDER — VENLAFAXINE HCL ER 75 MG PO CP24: 75.0000 mg | ORAL_CAPSULE | Freq: Every day | ORAL | 3 refills | Status: DC

## 2020-12-08 NOTE — Patient Instructions (Signed)
https://www.womenshealth.gov/menopause/menopause-basics"> https://www.clinicalkey.com">  Menopause Menopause is the normal time of a woman's life when menstrual periods stop completely. It marks the natural end to a woman's ability to become pregnant. It can be defined as the absence of a menstrual period for 12 months without another medical cause. The transition to menopause (perimenopause) most often happens between the ages of 45 and 55, and can last for many years. During perimenopause, hormone levels change in your body, which can cause symptoms and affect your health. Menopause may increase your risk for: Weakened bones (osteoporosis), which causes fractures. Depression. Hardening and narrowing of the arteries (atherosclerosis), which can cause heart attacks and strokes. What are the causes? This condition is usually caused by a natural change in hormone levels that happens as you get older. The condition may also be caused by changes that are not natural, including: Surgery to remove both ovaries (surgical menopause). Side effects from some medicines, such as chemotherapy used to treat cancer (chemical menopause). What increases the risk? This condition is more likely to start at an earlier age if you have certain medical conditions or have undergone treatments, including: A tumor of the pituitary gland in the brain. A disease that affects the ovaries and hormones. Certain cancer treatments, such as chemotherapy or hormone therapy, or radiation therapy on the pelvis. Heavy smoking and excessive alcohol use. Family history of early menopause. This condition is also more likely to develop earlier in women who are verythin. What are the signs or symptoms? Symptoms of this condition include: Hot flashes. Irregular menstrual periods. Night sweats. Changes in feelings about sex. This could be a decrease in sex drive or an increased discomfort around your sexuality. Vaginal dryness and  thinning of the vaginal walls. This may cause painful sex. Dryness of the skin and development of wrinkles. Headaches. Problems sleeping (insomnia). Mood swings or irritability. Memory problems. Weight gain. Hair growth on the face and chest. Bladder infections or problems with urinating. How is this diagnosed? This condition is diagnosed based on your medical history, a physical exam, your age, your menstrual history, and your symptoms. Hormone tests may also bedone. How is this treated? In some cases, no treatment is needed. You and your health care provider should make a decision together about whether treatment is necessary. Treatment will be based on your individual condition and preferences. Treatment for this condition focuses on managing symptoms. Treatment may include: Menopausal hormone therapy (MHT). Medicines to treat specific symptoms or complications. Acupuncture. Vitamin or herbal supplements. Before starting treatment, make sure to let your health care provider know if you have a personal or family history of these conditions: Heart disease. Breast cancer. Blood clots. Diabetes. Osteoporosis. Follow these instructions at home: Lifestyle Do not use any products that contain nicotine or tobacco, such as cigarettes, e-cigarettes, and chewing tobacco. If you need help quitting, ask your health care provider. Get at least 30 minutes of physical activity on 5 or more days each week. Avoid alcoholic and caffeinated beverages, as well as spicy foods. This may help prevent hot flashes. Get 7-8 hours of sleep each night. If you have hot flashes, try: Dressing in layers. Avoiding things that may trigger hot flashes, such as spicy food, warm places, or stress. Taking slow, deep breaths when a hot flash starts. Keeping a fan in your home and office. Find ways to manage stress, such as deep breathing, meditation, or journaling. Consider going to group therapy with other women who  are having menopause symptoms. Ask your   health care provider about recommended group therapy meetings. Eating and drinking  Eat a healthy, balanced diet that contains whole grains, lean protein, low-fat dairy, and plenty of fruits and vegetables. Your health care provider may recommend adding more soy to your diet. Foods that contain soy include tofu, tempeh, and soy milk. Eat plenty of foods that contain calcium and vitamin D for bone health. Items that are rich in calcium include low-fat milk, yogurt, beans, almonds, sardines, broccoli, and kale.  Medicines Take over-the-counter and prescription medicines only as told by your health care provider. Talk with your health care provider before starting any herbal supplements. If prescribed, take vitamins and supplements as told by your health care provider. General instructions  Keep track of your menstrual periods, including: When they occur. How heavy they are and how long they last. How much time passes between periods. Keep track of your symptoms, noting when they start, how often you have them, and how long they last. Use vaginal lubricants or moisturizers to help with vaginal dryness and improve comfort during sex. Keep all follow-up visits. This is important. This includes any group therapy or counseling.  Contact a health care provider if: You are still having menstrual periods after age 55. You have pain during sex. You have not had a period for 12 months and you develop vaginal bleeding. Get help right away if you have: Severe depression. Excessive vaginal bleeding. Pain when you urinate. A fast or irregular heartbeat (palpitations). Severe headaches. Abdominal pain or severe indigestion. Summary Menopause is a normal time of life when menstrual periods stop completely. It is usually defined as the absence of a menstrual period for 12 months without another medical cause. The transition to menopause (perimenopause) most often  happens between the ages of 45 and 55 and can last for several years. Symptoms can be managed through medicines, lifestyle changes, and complementary therapies such as acupuncture. Eat a balanced diet that is rich in nutrients to promote bone health and heart health and to manage symptoms during menopause. This information is not intended to replace advice given to you by your health care provider. Make sure you discuss any questions you have with your healthcare provider. Document Revised: 01/21/2020 Document Reviewed: 10/07/2019 Elsevier Patient Education  2022 Elsevier Inc.  

## 2020-12-08 NOTE — Progress Notes (Signed)
Established patient visit   Patient: Rose Marsh   DOB: 23-Apr-1966   55 y.o. Female  MRN: [id number] Visit Date: 12/08/2020  Today's healthcare provider: Vernie Murders, PA-C   Chief Complaint  Patient presents with   Hot Flashes   Subjective    HPI Patient presents today c/o worsening hot flashes. She reports that she has had symptoms since age 28. She has tried Prempro, birth control, OTC meds such as Black Cohosh with no relief. She reports that they have become more frequent in the last few weeks.   Wt Readings from Last 3 Encounters:  12/08/20 153 lb (69.4 kg)  05/03/20 150 lb (68 kg)  10/22/19 152 lb (68.9 kg)   BP Readings from Last 3 Encounters:  12/08/20 138/72  05/03/20 133/78  10/29/19 128/84      Past Medical History:  Diagnosis Date   BV (bacterial vaginosis)    Hypertension    Ovarian cyst    Vasomotor symptoms due to menopause    Past Surgical History:  Procedure Laterality Date   LAPAROSCOPIC OVARIAN CYSTECTOMY Right    Social History   Tobacco Use   Smoking status: Never   Smokeless tobacco: Never  Vaping Use   Vaping Use: Never used  Substance Use Topics   Alcohol use: Yes    Comment: infrequently   Drug use: Never   Family Status  Relation Name Status   Mother  Alive   Father  Deceased   Sister  Alive   PGM  Deceased   Neg Hx  (Not Specified)   No Known Allergies     Medications: Outpatient Medications Prior to Visit  Medication Sig   cetirizine (ZYRTEC) 10 MG tablet Take 1 tablet (10 mg total) by mouth daily.   cloNIDine (CATAPRES) 0.1 MG tablet Take 1 tablet (0.1 mg total) by mouth daily.   hydrOXYzine (ATARAX/VISTARIL) 10 MG tablet TAKE 1 TABLET BY MOUTH AS DIRECTED AS NEEDED FOR INSOMNIA   terconazole (TERAZOL 7) 0.4 % vaginal cream Place 1 applicator vaginally at bedtime.   No facility-administered medications prior to visit.    Review of Systems  Constitutional:  Negative for activity change.   Respiratory:  Negative for cough and shortness of breath.   Cardiovascular:  Negative for chest pain, palpitations and leg swelling.  Musculoskeletal:  Negative for arthralgias and myalgias.  Neurological:  Negative for dizziness and headaches.   Last CBC Lab Results  Component Value Date   WBC 6.5 05/03/2020   HGB 14.7 05/03/2020   HCT 43.4 05/03/2020   MCV 82 05/03/2020   MCH 27.7 05/03/2020   RDW 14.5 05/03/2020   PLT 221 [id number]   Last metabolic panel Lab Results  Component Value Date   GLUCOSE 81 05/03/2020   NA 145 (H) 05/03/2020   K 4.0 05/03/2020   CL 105 05/03/2020   CO2 24 05/03/2020   BUN 15 05/03/2020   CREATININE 0.84 05/03/2020   GFRNONAA 78 05/03/2020   GFRAA 90 05/03/2020   CALCIUM 9.8 05/03/2020   PROT 6.9 05/03/2020   ALBUMIN 4.7 05/03/2020   LABGLOB 2.2 05/03/2020   AGRATIO 2.1 05/03/2020   BILITOT 0.2 05/03/2020   ALKPHOS 63 05/03/2020   AST 22 05/03/2020   ALT 22 05/03/2020   Last thyroid functions Lab Results  Component Value Date   TSH 1.210 05/03/2020       Objective    BP 138/72   Pulse 74   Resp 16  Ht '5\' 7"'$  (1.702 m)   Wt 153 lb (69.4 kg)   BMI 23.96 kg/m  BP Readings from Last 3 Encounters:  12/08/20 138/72  05/03/20 133/78  10/29/19 128/84   Wt Readings from Last 3 Encounters:  12/08/20 153 lb (69.4 kg)  05/03/20 150 lb (68 kg)  10/22/19 152 lb (68.9 kg)    Physical Exam Constitutional:      General: She is not in acute distress.    Appearance: She is well-developed.  HENT:     Head: Normocephalic and atraumatic.     Right Ear: Hearing and tympanic membrane normal.     Left Ear: Hearing and tympanic membrane normal.     Nose: Nose normal.  Eyes:     General: Lids are normal. No scleral icterus.       Right eye: No discharge.        Left eye: No discharge.     Conjunctiva/sclera: Conjunctivae normal.  Cardiovascular:     Rate and Rhythm: Normal rate and regular rhythm.     Pulses: Normal pulses.      Heart sounds: Normal heart sounds.  Pulmonary:     Effort: Pulmonary effort is normal. No respiratory distress.     Breath sounds: Normal breath sounds.  Abdominal:     General: Bowel sounds are normal.     Palpations: Abdomen is soft.  Musculoskeletal:        General: Normal range of motion.     Cervical back: Neck supple.  Skin:    Findings: No lesion or rash.  Neurological:     Mental Status: She is alert and oriented to person, place, and time.  Psychiatric:        Speech: Speech normal.        Behavior: Behavior normal.        Thought Content: Thought content normal.      No results found for any visits on 12/08/20.  Assessment & Plan     1. Hot flushes, perimenopausal Experiencing menopause symptoms the past 5-6 years. No hysterectomy. No relief of hot flashes each hour with the use of hormones (BCP), OTC supplements or Clonidine. Requests trial of Effexor. Should recheck here in 3 months. - venlafaxine XR (EFFEXOR-XR) 75 MG 24 hr capsule; Take 1 capsule (75 mg total) by mouth daily with breakfast.  Dispense: 30 capsule; Refill: 3  2. Primary hypertension Well controlled BP on the Catapres 0.1 mg qd. No chest pains, dyspnea or peripheral edema.   No follow-ups on file.      I, Jasen Hartstein, PA-C, have reviewed all documentation for this visit. The documentation on 12/08/20 for the exam, diagnosis, procedures, and orders are all accurate and complete.    Vernie Murders, PA-C  [employer] [phone] (phone) [phone] (fax)  [place]

## 2020-12-27 ENCOUNTER — Ambulatory Visit: Payer: Self-pay | Admitting: *Deleted

## 2020-12-27 NOTE — Telephone Encounter (Signed)
Reason for Disposition  [1] Caller has URGENT medicine question about med that PCP or specialist prescribed AND [2] triager unable to answer question    Effexor giving her side effects and it's not controlling the hot flashes  Answer Assessment - Initial Assessment Questions 1. NAME of MEDICATION: "What medicine are you calling about?"     Effexor for hot flashes.  I saw Vernie Murders, PA-C on 12/08/2020.  I took my first dose of the Effexor on that Friday and did not have a single hot flash all day!  It was wonderful!    2. QUESTION: "What is your question?" (e.g., double dose of medicine, side effect)     It worked for that one day no hot flashes but I can't sleep, I'm lacking motivation, and I feel depressed and the hot flashes have come back. Is it possible to have it prescribed in a 1/2 dose?   It's a capsule so I wasn't able to break it in half and try a half dose.  3. PRESCRIBING HCP: "Who prescribed it?" Reason: if prescribed by specialist, call should be referred to that group.     Dennis Chrismon, PA-C 4. SYMPTOMS: "Do you have any symptoms?"     Yes can't sleep, lacking in motivation, feeling depressed and the hot flashes are back. 5. SEVERITY: If symptoms are present, ask "Are they mild, moderate or severe?"     Severe.   I have stopped the Effexor because the side effects were bothering me so bad. 6. PREGNANCY:  "Is there any chance that you are pregnant?" "When was your last menstrual period?"     Not asked  Protocols used: Medication Question Call-A-AH

## 2020-12-27 NOTE — Telephone Encounter (Signed)
I returned pt's call.   She had called in c/o side effects from the Effexor she was started on for hot flashes by [employer], PA-C.   She has stopped taking it.  I've sent a message to [facility] letting Vernie Murders, PA-C know her concerns.   She was agreeable to someone calling her back.  [phone].

## 2020-12-27 NOTE — Telephone Encounter (Signed)
I called and advised patient that Rose Marsh is out of the office today, but would return tomorrow. Patient is fine with waiting until Rose Marsh returns on 12/28/2020 for recommendations. Please advise.

## 2020-12-29 ENCOUNTER — Other Ambulatory Visit: Payer: Self-pay | Admitting: Family Medicine

## 2020-12-29 ENCOUNTER — Encounter: Payer: Self-pay | Admitting: Family Medicine

## 2020-12-29 DIAGNOSIS — N951 Menopausal and female climacteric states: Secondary | ICD-10-CM

## 2020-12-29 MED ORDER — VENLAFAXINE HCL ER 37.5 MG PO CP24: 37.5000 mg | ORAL_CAPSULE | Freq: Every day | ORAL | 0 refills | Status: DC

## 2020-12-29 NOTE — Progress Notes (Signed)
Having some sleep disturbance with 75 mg of Effexor but hot flashes controlled. Requests change to 37.5 mg qd.

## 2021-01-03 ENCOUNTER — Telehealth: Payer: Self-pay | Admitting: Family Medicine

## 2021-01-03 DIAGNOSIS — N951 Menopausal and female climacteric states: Secondary | ICD-10-CM

## 2021-01-03 MED ORDER — CLONIDINE HCL 0.1 MG PO TABS: 0.1000 mg | ORAL_TABLET | Freq: Every day | ORAL | 1 refills | Status: DC

## 2021-01-03 NOTE — Telephone Encounter (Signed)
[facility] faxed refill request for the following medications:   cloNIDine (CATAPRES) 0.1 MG tablet   Please advise.

## 2021-01-19 ENCOUNTER — Other Ambulatory Visit: Payer: Self-pay

## 2021-01-19 ENCOUNTER — Ambulatory Visit: Payer: Managed Care, Other (non HMO) | Admitting: Family Medicine

## 2021-01-19 ENCOUNTER — Encounter: Payer: Self-pay | Admitting: Family Medicine

## 2021-01-19 VITALS — BP 162/94 | HR 74 | Temp 98.2°F | Ht 67.0 in | Wt 152.9 lb

## 2021-01-19 DIAGNOSIS — N644 Mastodynia: Secondary | ICD-10-CM | POA: Diagnosis not present

## 2021-01-19 DIAGNOSIS — Z1231 Encounter for screening mammogram for malignant neoplasm of breast: Secondary | ICD-10-CM | POA: Insufficient documentation

## 2021-01-19 DIAGNOSIS — I10 Essential (primary) hypertension: Secondary | ICD-10-CM

## 2021-01-19 DIAGNOSIS — N951 Menopausal and female climacteric states: Secondary | ICD-10-CM | POA: Diagnosis not present

## 2021-01-19 DIAGNOSIS — M545 Low back pain, unspecified: Secondary | ICD-10-CM | POA: Insufficient documentation

## 2021-01-19 DIAGNOSIS — M47817 Spondylosis without myelopathy or radiculopathy, lumbosacral region: Secondary | ICD-10-CM | POA: Diagnosis not present

## 2021-01-19 MED ORDER — PAROXETINE HCL ER 12.5 MG PO TB24: 12.5000 mg | ORAL_TABLET | Freq: Every day | ORAL | 1 refills | Status: DC

## 2021-01-19 NOTE — Assessment & Plan Note (Signed)
D/t change in skin texture/tone of L breast and L breast pain

## 2021-01-19 NOTE — Progress Notes (Signed)
Established patient visit   Patient: Rose Marsh   DOB: 03/24/1966   55 y.o. Female  MRN: [id number] Visit Date: 01/19/2021  Today's healthcare provider: [name], FNP   New breast pain, hot flashes treatment, intermittent hip pain  Subjective    HPI  Pain in breast bilateral for 2 months patient describes it as a burning sensation that comes and goes Hip pain bilateral for 1 year patient describes it as a burning sensation that comes and goes Hot flashes    Medications: Outpatient Medications Prior to Visit  Medication Sig Note   cetirizine (ZYRTEC) 10 MG tablet Take 1 tablet (10 mg total) by mouth daily. 01/19/2021: PATIENT TAKES OTC VERSION IF NEEDED   cloNIDine (CATAPRES) 0.1 MG tablet Take 1 tablet (0.1 mg total) by mouth daily.    hydrOXYzine (ATARAX/VISTARIL) 10 MG tablet TAKE 1 TABLET BY MOUTH AS DIRECTED AS NEEDED FOR INSOMNIA    terconazole (TERAZOL 7) 0.4 % vaginal cream Place 1 applicator vaginally at bedtime.    [DISCONTINUED] venlafaxine XR (EFFEXOR-XR) 37.5 MG 24 hr capsule Take 1 capsule (37.5 mg total) by mouth daily with breakfast.    No facility-administered medications prior to visit.    Review of Systems     Objective    BP (!) 162/94 (BP Location: Right Arm, Patient Position: Sitting, Cuff Size: Normal)   Pulse 74   Temp 98.2 F (36.8 C) (Oral)   Ht '5\' 7"'$  (1.702 m)   Wt 152 lb 14.4 oz (69.4 kg)   SpO2 100%   BMI 23.95 kg/m  {Show previous vital signs (optional):23777}  Physical Exam Vitals and nursing note reviewed.  Constitutional:      General: She is not in acute distress.    Appearance: Normal appearance. She is overweight. She is not ill-appearing, toxic-appearing or diaphoretic.  HENT:     Head: Normocephalic and atraumatic.  Cardiovascular:     Rate and Rhythm: Normal rate and regular rhythm.     Pulses: Normal pulses.     Heart sounds: Normal heart sounds. No murmur heard.   No friction rub. No gallop.   Pulmonary:     Effort: Pulmonary effort is normal. No respiratory distress.     Breath sounds: Normal breath sounds. No stridor. No wheezing, rhonchi or rales.  Chest:     Chest wall: Tenderness present.  Breasts:    Tanner Score is 5.     Left: Skin change and tenderness present.       Comments: L breast with "burning pain" L breast with decreased size in comparison to R; unknown length of time for patient L breast change in skin texture- wrinkled/loose 6/7pm  Abdominal:     General: Bowel sounds are normal.     Palpations: Abdomen is soft.  Musculoskeletal:        General: No swelling, tenderness, deformity or signs of injury. Normal range of motion.     Right lower leg: No edema.     Left lower leg: No edema.  Lymphadenopathy:     Upper Body:     Right upper body: No supraclavicular, axillary or pectoral adenopathy.     Left upper body: No supraclavicular, axillary or pectoral adenopathy.  Skin:    General: Skin is warm and dry.     Capillary Refill: Capillary refill takes less than 2 seconds.     Coloration: Skin is not jaundiced or pale.     Findings: No bruising, erythema, lesion  or rash.  Neurological:     General: No focal deficit present.     Mental Status: She is alert and oriented to person, place, and time. Mental status is at baseline.     Cranial Nerves: No cranial nerve deficit.     Sensory: No sensory deficit.     Motor: No weakness.     Coordination: Coordination normal.  Psychiatric:        Mood and Affect: Mood normal.        Behavior: Behavior normal.        Thought Content: Thought content normal.        Judgment: Judgment normal.     No results found for any visits on 01/19/21.  Assessment & Plan     Problem List Items Addressed This Visit       Cardiovascular and Mediastinum   Hot flushes, perimenopausal    Suicidal thoughts d/t previous medication Stopped today Start of Paxil trial- low dose Patient has already tried multiple other  agents      Relevant Medications   PARoxetine (PAXIL CR) 12.5 MG 24 hr tablet   Hypertension    Elevated today; continue to monitor May need additional medication at later date        Musculoskeletal and Integument   DJD (degenerative joint disease), lumbosacral    Seen on Xray R hip pain No pain today Advised IT band stretching Advised '800mg'$  Ibuprofen x3 doses per day x3 days if flares        Other   Breast pain, left - Primary    Semi acute pain to L breast Noticeable skin changes Denies excess caffeine intake "burning pain" Performs self exams Denies lumps- none felt on exam      Relevant Orders   MM 3D SCREEN BREAST UNI LEFT   Screening mammogram, encounter for    D/t change in skin texture/tone of L breast and L breast pain      Relevant Orders   MM 3D SCREEN BREAST UNI LEFT   Low back pain    Chronic; stable today Advised frequent position changes Advised standing desk if allowed in workplace       Return if symptoms worsen or fail to improve.      Vonna Kotyk, FNP, have reviewed all documentation for this visit. The documentation on 01/19/21 for the exam, diagnosis, procedures, and orders are all accurate and complete.  [name], [place] [phone] (phone) [phone] (fax)  [place]

## 2021-01-19 NOTE — Assessment & Plan Note (Signed)
Semi acute pain to L breast Noticeable skin changes Denies excess caffeine intake "burning pain" Performs self exams Denies lumps- none felt on exam

## 2021-01-19 NOTE — Assessment & Plan Note (Signed)
Chronic; stable today Advised frequent position changes Advised standing desk if allowed in workplace

## 2021-01-19 NOTE — Assessment & Plan Note (Signed)
Seen on Xray R hip pain No pain today Advised IT band stretching Advised '800mg'$  Ibuprofen x3 doses per day x3 days if flares

## 2021-01-19 NOTE — Assessment & Plan Note (Signed)
Elevated today; continue to monitor May need additional medication at later date

## 2021-01-19 NOTE — Assessment & Plan Note (Signed)
Suicidal thoughts d/t previous medication Stopped today Start of Paxil trial- low dose Patient has already tried multiple other agents

## 2021-01-29 ENCOUNTER — Encounter: Payer: Self-pay | Admitting: Family Medicine

## 2021-01-30 MED ORDER — PAROXETINE HCL 10 MG PO TABS: 10.0000 mg | ORAL_TABLET | Freq: Every day | ORAL | 1 refills | Status: DC

## 2021-03-13 ENCOUNTER — Other Ambulatory Visit: Payer: Self-pay | Admitting: Family Medicine

## 2021-03-13 DIAGNOSIS — N951 Menopausal and female climacteric states: Secondary | ICD-10-CM

## 2021-03-14 NOTE — Telephone Encounter (Signed)
Requested medications are due for refill today.  no  Requested medications are on the active medications list.  no  Last refill. 01/19/2021  Future visit scheduled.   yes  Notes to clinic.  This medication was discontinued on 01/30/2021 and replaced with a different dosage.

## 2021-03-16 ENCOUNTER — Other Ambulatory Visit: Payer: Self-pay | Admitting: Family Medicine

## 2021-03-16 MED ORDER — PAROXETINE HCL 10 MG PO TABS: 10.0000 mg | ORAL_TABLET | Freq: Every day | ORAL | 1 refills | Status: DC

## 2021-05-08 ENCOUNTER — Encounter: Payer: Managed Care, Other (non HMO) | Admitting: Physician Assistant

## 2021-05-08 ENCOUNTER — Encounter: Payer: Managed Care, Other (non HMO) | Admitting: Family Medicine

## 2021-06-01 ENCOUNTER — Other Ambulatory Visit: Payer: Self-pay

## 2021-06-01 ENCOUNTER — Other Ambulatory Visit: Payer: Self-pay | Admitting: Family Medicine

## 2021-06-01 ENCOUNTER — Ambulatory Visit (INDEPENDENT_AMBULATORY_CARE_PROVIDER_SITE_OTHER): Payer: Managed Care, Other (non HMO) | Admitting: Family Medicine

## 2021-06-01 ENCOUNTER — Encounter: Payer: Self-pay | Admitting: Family Medicine

## 2021-06-01 VITALS — BP 135/87 | HR 73 | Resp 15 | Ht 67.0 in | Wt 151.6 lb

## 2021-06-01 DIAGNOSIS — Z0000 Encounter for general adult medical examination without abnormal findings: Secondary | ICD-10-CM | POA: Insufficient documentation

## 2021-06-01 DIAGNOSIS — E7800 Pure hypercholesterolemia, unspecified: Secondary | ICD-10-CM | POA: Diagnosis not present

## 2021-06-01 DIAGNOSIS — N951 Menopausal and female climacteric states: Secondary | ICD-10-CM

## 2021-06-01 DIAGNOSIS — I10 Essential (primary) hypertension: Secondary | ICD-10-CM

## 2021-06-01 DIAGNOSIS — R718 Other abnormality of red blood cells: Secondary | ICD-10-CM | POA: Insufficient documentation

## 2021-06-01 DIAGNOSIS — Z1231 Encounter for screening mammogram for malignant neoplasm of breast: Secondary | ICD-10-CM

## 2021-06-01 MED ORDER — CLONIDINE HCL 0.1 MG PO TABS: 0.1000 mg | ORAL_TABLET | Freq: Every day | ORAL | 3 refills | Status: DC

## 2021-06-01 NOTE — Assessment & Plan Note (Signed)
Stable on Paxil 'no longer hateful' from the words of her spouse

## 2021-06-01 NOTE — Assessment & Plan Note (Signed)
UTD on dental UTD on eye Things to do to keep yourself healthy  - Exercise at least 30-45 minutes a day, 3-4 days a week.  - Eat a low-fat diet with lots of fruits and vegetables, up to 7-9 servings per day.  - Seatbelts can save your life. Wear them always.  - Smoke detectors on every level of your home, check batteries every year.  - Eye Doctor - have an eye exam every 1-2 years  - Safe sex - if you may be exposed to STDs, use a condom.  - Alcohol -  If you drink, do it moderately, less than 2 drinks per day.  - [place]. Choose someone to speak for you if you are not able.  - Depression is common in our stressful world.If you're feeling down or losing interest in things you normally enjoy, please come in for a visit.  - Violence - If anyone is threatening or hurting you, please call immediately.

## 2021-06-01 NOTE — Assessment & Plan Note (Signed)
Repeat RBC; hx of elevation No other abnormal levels Denies complaints

## 2021-06-01 NOTE — Progress Notes (Signed)
Complete physical exam   Patient: Rose Marsh   DOB: 1966/02/09   56 y.o. Female  MRN: [id number] Visit Date: 06/01/2021  Today's healthcare provider: [name], FNP   Chief Complaint  Patient presents with   Annual Exam   Subjective     HPI  Rose Marsh is a 56 y.o. female who presents today for a complete physical exam.  She reports consuming a general diet. Gym/ health club routine includes cardio. She generally feels well. She reports sleeping well. She does not have additional problems to discuss today.   Last Reported Pap- 05/03/20 Mammo- ordered 01/19/21; card provided to encourage scheduling Colonoscopy- done at [facility] 01/31/14  Past Medical History:  Diagnosis Date   BV (bacterial vaginosis)    Hypertension    Ovarian cyst    Vasomotor symptoms due to menopause    Past Surgical History:  Procedure Laterality Date   LAPAROSCOPIC OVARIAN CYSTECTOMY Right    Social History   Socioeconomic History   Marital status: Married    Spouse name: Not on file   Number of children: Not on file   Years of education: Not on file   Highest education level: Not on file  Occupational History   Not on file  Tobacco Use   Smoking status: Never   Smokeless tobacco: Never  Vaping Use   Vaping Use: Never used  Substance and Sexual Activity   Alcohol use: Yes    Comment: infrequently   Drug use: Never   Sexual activity: Yes    Birth control/protection: None  Other Topics Concern   Not on file  Social History Narrative   Not on file   Social Determinants of Health   Financial Resource Strain: Not on file  Food Insecurity: Not on file  Transportation Needs: Not on file  Physical Activity: Not on file  Stress: Not on file  Social Connections: Not on file  Intimate Partner Violence: Not on file   Family Status  Relation Name Status   Mother  Alive   Father  Deceased   Sister  Alive   PGM  Deceased   Neg Hx  (Not Specified)    Family History  Problem Relation Age of Onset   Arthritis Mother    Rheum arthritis Mother    Brain cancer Father    Stroke Father    Breast cancer Sister        89s   Bone cancer Paternal Grandmother    Colon cancer Neg Hx    No Known Allergies  Patient Care Team: [name], FNP as PCP - General (Family Medicine)   Medications: Outpatient Medications Prior to Visit  Medication Sig   hydrOXYzine (ATARAX/VISTARIL) 10 MG tablet TAKE 1 TABLET BY MOUTH AS DIRECTED AS NEEDED FOR INSOMNIA   PARoxetine (PAXIL) 10 MG tablet Take 1 tablet (10 mg total) by mouth daily.   terconazole (TERAZOL 7) 0.4 % vaginal cream Place 1 applicator vaginally at bedtime.   [DISCONTINUED] cetirizine (ZYRTEC) 10 MG tablet Take 1 tablet (10 mg total) by mouth daily.   [DISCONTINUED] cloNIDine (CATAPRES) 0.1 MG tablet Take 1 tablet (0.1 mg total) by mouth daily.   No facility-administered medications prior to visit.    Review of Systems  All other systems reviewed and are negative.    Objective    BP 135/87 Comment: home   Pulse 73    Resp 15    Ht 5\' 7"  (1.702 m)  Wt 151 lb 9.6 oz (68.8 kg)    SpO2 100%    BMI 23.74 kg/m    Physical Exam Vitals and nursing note reviewed.  Constitutional:      General: She is awake. She is not in acute distress.    Appearance: Normal appearance. She is well-developed, well-groomed and normal weight. She is not ill-appearing, toxic-appearing or diaphoretic.  HENT:     Head: Normocephalic and atraumatic.     Jaw: There is normal jaw occlusion. No trismus, tenderness, swelling or pain on movement.     Right Ear: Hearing, tympanic membrane, ear canal and external ear normal. There is no impacted cerumen.     Left Ear: Hearing, tympanic membrane, ear canal and external ear normal. There is no impacted cerumen.     Nose: Nose normal. No congestion or rhinorrhea.     Right Turbinates: Not enlarged, swollen or pale.     Left Turbinates: Not enlarged, swollen or  pale.     Right Sinus: No maxillary sinus tenderness or frontal sinus tenderness.     Left Sinus: No maxillary sinus tenderness or frontal sinus tenderness.     Mouth/Throat:     Lips: Pink.     Mouth: Mucous membranes are moist. No injury.     Tongue: No lesions.     Pharynx: Oropharynx is clear. Uvula midline. No pharyngeal swelling, oropharyngeal exudate, posterior oropharyngeal erythema or uvula swelling.     Tonsils: No tonsillar exudate or tonsillar abscesses.  Eyes:     General: Lids are normal. Lids are everted, no foreign bodies appreciated. Vision grossly intact. Gaze aligned appropriately. No allergic shiner or visual field deficit.       Right eye: No discharge.        Left eye: No discharge.     Extraocular Movements: Extraocular movements intact.     Conjunctiva/sclera: Conjunctivae normal.     Right eye: Right conjunctiva is not injected. No exudate.    Left eye: Left conjunctiva is not injected. No exudate.    Pupils: Pupils are equal, round, and reactive to light.  Neck:     Thyroid: No thyroid mass, thyromegaly or thyroid tenderness.     Vascular: No carotid bruit.     Trachea: Trachea normal.  Cardiovascular:     Rate and Rhythm: Normal rate and regular rhythm.     Pulses: Normal pulses.          Carotid pulses are 2+ on the right side and 2+ on the left side.      Radial pulses are 2+ on the right side and 2+ on the left side.       Dorsalis pedis pulses are 2+ on the right side and 2+ on the left side.       Posterior tibial pulses are 2+ on the right side and 2+ on the left side.     Heart sounds: Normal heart sounds, S1 normal and S2 normal. No murmur heard.   No friction rub. No gallop.  Pulmonary:     Effort: Pulmonary effort is normal. No respiratory distress.     Breath sounds: Normal breath sounds and air entry. No stridor. No wheezing, rhonchi or rales.  Chest:     Chest wall: No tenderness.     Comments: Breast exam deferred; discussed self  exam Abdominal:     General: Abdomen is flat. Bowel sounds are normal. There is no distension.     Palpations: Abdomen is soft. There is  no mass.     Tenderness: There is no abdominal tenderness. There is no right CVA tenderness, left CVA tenderness, guarding or rebound.     Hernia: No hernia is present.  Genitourinary:    Comments: Exam deferred; denies complaints Musculoskeletal:        General: No swelling, tenderness, deformity or signs of injury. Normal range of motion.     Cervical back: Full passive range of motion without pain, normal range of motion and neck supple. No edema, rigidity or tenderness. No muscular tenderness.     Right lower leg: No edema.     Left lower leg: No edema.  Lymphadenopathy:     Cervical: No cervical adenopathy.     Right cervical: No superficial, deep or posterior cervical adenopathy.    Left cervical: No superficial, deep or posterior cervical adenopathy.  Skin:    General: Skin is warm and dry.     Capillary Refill: Capillary refill takes less than 2 seconds.     Coloration: Skin is not jaundiced or pale.     Findings: No bruising, erythema, lesion or rash.  Neurological:     General: No focal deficit present.     Mental Status: She is alert and oriented to person, place, and time. Mental status is at baseline.     GCS: GCS eye subscore is 4. GCS verbal subscore is 5. GCS motor subscore is 6.     Sensory: Sensation is intact. No sensory deficit.     Motor: Motor function is intact. No weakness.     Coordination: Coordination is intact. Coordination normal.     Gait: Gait is intact. Gait normal.  Psychiatric:        Attention and Perception: Attention and perception normal.        Mood and Affect: Mood and affect normal.        Speech: Speech normal.        Behavior: Behavior normal. Behavior is cooperative.        Thought Content: Thought content normal.        Cognition and Memory: Cognition and memory normal.        Judgment: Judgment  normal.   Last depression screening scores PHQ 2/9 Scores 06/01/2021 01/19/2021 05/03/2020  PHQ - 2 Score 0 0 0  PHQ- 9 Score 0 0 0   Last fall risk screening Fall Risk  01/19/2021  Falls in the past year? 0  Number falls in past yr: 0  Injury with Fall? 0  Risk for fall due to : No Fall Risks  Follow up -   Last Audit-C alcohol use screening Alcohol Use Disorder Test (AUDIT) 06/01/2021  1. How often do you have a drink containing alcohol? 1  2. How many drinks containing alcohol do you have on a typical day when you are drinking? 0  3. How often do you have six or more drinks on one occasion? 0  AUDIT-C Score 1  Alcohol Brief Interventions/Follow-up -   A score of 3 or more in women, and 4 or more in men indicates increased risk for alcohol abuse, EXCEPT if all of the points are from question 1   No results found for any visits on 06/01/21.  Assessment & Plan    Routine Health Maintenance and Physical Exam  Exercise Activities and Dietary recommendations  Goals   None     Immunization History  Administered Date(s) Administered   Influenza, Seasonal, Injecte, Preservative Fre 02/04/2016   Influenza,inj,Quad  PF,6+ Mos 02/07/2015, 01/22/2019   Influenza-Unspecified 01/19/2020, 02/17/2021   Moderna Sars-Covid-2 Vaccination 05/05/2019, 06/04/2019, 04/18/2020   PPD Test 03/29/2013   Tdap 10/25/2011, 03/29/2013    Health Maintenance  Topic Date Due   COVID-19 Vaccine (4 - Booster for Moderna series) 06/17/2021 (Originally 06/13/2020)   Zoster Vaccines- Shingrix (1 of 2) 08/30/2021 (Originally 02/20/2014)   MAMMOGRAM  07/29/2022   TETANUS/TDAP  03/30/2023   COLONOSCOPY (Pts 45-37yrs Insurance coverage will need to be confirmed)  02/01/2024   PAP SMEAR-Modifier  05/03/2025   INFLUENZA VACCINE  Completed   Hepatitis C Screening  Completed   HIV Screening  Completed   HPV VACCINES  Aged Out    Discussed health benefits of physical activity, and encouraged her to engage in  regular exercise appropriate for her age and condition.  Problem List Items Addressed This Visit       Cardiovascular and Mediastinum   Primary hypertension   Relevant Medications   cloNIDine (CATAPRES) 0.1 MG tablet     Other   Climacteric    Stable on Paxil 'no longer hateful' from the words of her spouse      Relevant Medications   cloNIDine (CATAPRES) 0.1 MG tablet   Annual physical exam - Primary    UTD on dental UTD on eye Things to do to keep yourself healthy  - Exercise at least 30-45 minutes a day, 3-4 days a week.  - Eat a low-fat diet with lots of fruits and vegetables, up to 7-9 servings per day.  - Seatbelts can save your life. Wear them always.  - Smoke detectors on every level of your home, check batteries every year.  - Eye Doctor - have an eye exam every 1-2 years  - Safe sex - if you may be exposed to STDs, use a condom.  - Alcohol -  If you drink, do it moderately, less than 2 drinks per day.  - [place]. Choose someone to speak for you if you are not able.  - Depression is common in our stressful world.If you're feeling down or losing interest in things you normally enjoy, please come in for a visit.  - Violence - If anyone is threatening or hurting you, please call immediately.        Relevant Orders   Comprehensive metabolic panel   Elevated cholesterol    Hx of elevated total cholesterol Repeat panel Encouraged exercise and lower fat diet      Relevant Medications   cloNIDine (CATAPRES) 0.1 MG tablet   Other Relevant Orders   Lipid panel   Elevated red blood cell count    Repeat RBC; hx of elevation No other abnormal levels Denies complaints       Relevant Orders   CBC with Differential/Platelet     Return in about 1 year (around 06/01/2022) for annual examination.     Vonna Kotyk, FNP, have reviewed all documentation for this visit. The documentation on 06/01/21 for the exam, diagnosis, procedures, and  orders are all accurate and complete.  Patient seen and examined by Tally Joe,  FNP note scribed by [employer], [place], [place] [phone] (phone) [phone] (fax)  [place]

## 2021-06-01 NOTE — Assessment & Plan Note (Signed)
Hx of elevated total cholesterol Repeat panel Encouraged exercise and lower fat diet

## 2021-06-02 LAB — LIPID PANEL
Chol/HDL Ratio: 2.4 ratio (ref 0.0–4.4)
Cholesterol, Total: 214 mg/dL — ABNORMAL HIGH (ref 100–199)
HDL: 89 mg/dL (ref >39)
LDL Chol Calc (NIH): 114 mg/dL — ABNORMAL HIGH (ref 0–99)
Triglycerides: 63 mg/dL (ref 0–149)
VLDL Cholesterol Cal: 11 mg/dL (ref 5–40)

## 2021-06-02 LAB — CBC WITH DIFFERENTIAL/PLATELET
Basophils Absolute: 0 10*3/uL (ref 0.0–0.2)
Basos: 1 %
EOS (ABSOLUTE): 0 10*3/uL (ref 0.0–0.4)
Eos: 1 %
Hematocrit: 44.8 % (ref 34.0–46.6)
Hemoglobin: 14.9 g/dL (ref 11.1–15.9)
Immature Grans (Abs): 0 10*3/uL (ref 0.0–0.1)
Immature Granulocytes: 0 %
Lymphocytes Absolute: 1.9 10*3/uL (ref 0.7–3.1)
Lymphs: 47 %
MCH: 26.8 pg (ref 26.6–33.0)
MCHC: 33.3 g/dL (ref 31.5–35.7)
MCV: 80 fL (ref 79–97)
Monocytes Absolute: 0.3 10*3/uL (ref 0.1–0.9)
Monocytes: 8 %
Neutrophils Absolute: 1.7 10*3/uL (ref 1.4–7.0)
Neutrophils: 43 %
Platelets: 250 10*3/uL (ref 150–450)
RBC: 5.57 x10E6/uL — ABNORMAL HIGH (ref 3.77–5.28)
RDW: 14.1 % (ref 11.7–15.4)
WBC: 4.1 10*3/uL (ref 3.4–10.8)

## 2021-06-02 LAB — COMPREHENSIVE METABOLIC PANEL
ALT: 46 IU/L — ABNORMAL HIGH (ref 0–32)
AST: 66 IU/L — ABNORMAL HIGH (ref 0–40)
Albumin/Globulin Ratio: 2.4 — ABNORMAL HIGH (ref 1.2–2.2)
Albumin: 5.1 g/dL — ABNORMAL HIGH (ref 3.8–4.9)
Alkaline Phosphatase: 79 IU/L (ref 44–121)
BUN/Creatinine Ratio: 15 (ref 9–23)
BUN: 12 mg/dL (ref 6–24)
Bilirubin Total: 0.4 mg/dL (ref 0.0–1.2)
CO2: 26 mmol/L (ref 20–29)
Calcium: 10.1 mg/dL (ref 8.7–10.2)
Chloride: 102 mmol/L (ref 96–106)
Creatinine, Ser: 0.78 mg/dL (ref 0.57–1.00)
Globulin, Total: 2.1 g/dL (ref 1.5–4.5)
Glucose: 82 mg/dL (ref 70–99)
Potassium: 4.3 mmol/L (ref 3.5–5.2)
Sodium: 142 mmol/L (ref 134–144)
Total Protein: 7.2 g/dL (ref 6.0–8.5)
eGFR: 89 mL/min/{1.73_m2} (ref >59)

## 2021-06-26 ENCOUNTER — Encounter: Payer: Self-pay | Admitting: Obstetrics and Gynecology

## 2021-06-26 ENCOUNTER — Ambulatory Visit (INDEPENDENT_AMBULATORY_CARE_PROVIDER_SITE_OTHER): Payer: Managed Care, Other (non HMO) | Admitting: Obstetrics and Gynecology

## 2021-06-26 ENCOUNTER — Other Ambulatory Visit: Payer: Self-pay

## 2021-06-26 VITALS — BP 110/70 | Ht 67.0 in | Wt 153.0 lb

## 2021-06-26 DIAGNOSIS — Z1231 Encounter for screening mammogram for malignant neoplasm of breast: Secondary | ICD-10-CM | POA: Diagnosis not present

## 2021-06-26 DIAGNOSIS — N952 Postmenopausal atrophic vaginitis: Secondary | ICD-10-CM

## 2021-06-26 DIAGNOSIS — Z803 Family history of malignant neoplasm of breast: Secondary | ICD-10-CM | POA: Diagnosis not present

## 2021-06-26 DIAGNOSIS — Z01419 Encounter for gynecological examination (general) (routine) without abnormal findings: Secondary | ICD-10-CM

## 2021-06-26 NOTE — Progress Notes (Signed)
PCP: [name], FNP   Chief Complaint  Patient presents with   Gynecologic Exam    No concerns    HPI:      [name] is a 56 y.o. [phone] who LMP was No LMP recorded. Patient is postmenopausal., presents today for her annual examination.  Her menses are absent due to menopause. Had some PMB when started HRT a couple yrs ago but has been off it over a year and has had 1 episode PMB about a yr ago, none recently. GYN u/s 6/21 was EM=4.8 mm and with several leio.  Has tolerable vasomotor sx, taking paxil with some relief.  Sex activity: sexually active. She does have vaginal dryness and is using estrace crm occas with sx control; doesn't need Rx RF currently.  Last Pap: 05/03/20 Results were: no abnormalities /neg HPV DNA with PCP.  Last mammogram: 07/28/20 Results were: normal--routine follow-up in 12 months; has appt 3/23 There is a FH of breast cancer in her sister, genetic testing not done and pt declined in past. There is no FH of ovarian cancer. The patient does self-breast exams.  Colonoscopy: age 58 without abnormalities;  Repeat due after 10 years.   Tobacco use: The patient denies current or previous tobacco use. Alcohol use: none  No drug use. Exercise: moderately active  She does get adequate calcium but not Vitamin D in her diet.  Labs with PCP.   Patient Active Problem List   Diagnosis Date Noted   Family history of breast cancer 06/26/2021   Annual physical exam 06/01/2021   Elevated cholesterol 06/01/2021   Elevated red blood cell count 06/01/2021   Primary hypertension 06/01/2021   Breast pain, left 01/19/2021   DJD (degenerative joint disease), lumbosacral 01/19/2021   Screening mammogram, encounter for 01/19/2021   Low back pain 01/19/2021   Climacteric 11/12/2017   Hot flushes, perimenopausal 11/12/2017   Vaginal atrophy 11/12/2017   Dyspareunia in female 11/12/2017    Past Surgical History:  Procedure Laterality Date    LAPAROSCOPIC OVARIAN CYSTECTOMY Right     Family History  Problem Relation Age of Onset   Arthritis Mother    Rheum arthritis Mother    Brain cancer Father    Stroke Father    Breast cancer Sister        33s   Bone cancer Paternal Grandmother    Colon cancer Neg Hx     Social History   Socioeconomic History   Marital status: Married    Spouse name: Not on file   Number of children: Not on file   Years of education: Not on file   Highest education level: Not on file  Occupational History   Not on file  Tobacco Use   Smoking status: Never   Smokeless tobacco: Never  Vaping Use   Vaping Use: Never used  Substance and Sexual Activity   Alcohol use: Yes    Comment: infrequently   Drug use: Never   Sexual activity: Yes    Birth control/protection: None  Other Topics Concern   Not on file  Social History Narrative   Not on file   Social Determinants of Health   Financial Resource Strain: Not on file  Food Insecurity: Not on file  Transportation Needs: Not on file  Physical Activity: Not on file  Stress: Not on file  Social Connections: Not on file  Intimate Partner Violence: Not on file     Current Outpatient Medications:  cloNIDine (CATAPRES) 0.1 MG tablet, Take 1 tablet (0.1 mg total) by mouth daily., Disp: 90 tablet, Rfl: 3   hydrOXYzine (ATARAX/VISTARIL) 10 MG tablet, TAKE 1 TABLET BY MOUTH AS DIRECTED AS NEEDED FOR INSOMNIA, Disp: 90 tablet, Rfl: 3   PARoxetine (PAXIL) 10 MG tablet, Take 1 tablet (10 mg total) by mouth daily., Disp: 90 tablet, Rfl: 1   terconazole (TERAZOL 7) 0.4 % vaginal cream, Place 1 applicator vaginally at bedtime., Disp: 45 g, Rfl: 0     ROS:  Review of Systems  Constitutional:  Negative for fatigue, fever and unexpected weight change.  Respiratory:  Negative for cough, shortness of breath and wheezing.   Cardiovascular:  Negative for chest pain, palpitations and leg swelling.  Gastrointestinal:  Negative for blood in stool,  constipation, diarrhea, nausea and vomiting.  Endocrine: Negative for cold intolerance, heat intolerance and polyuria.  Genitourinary:  Negative for dyspareunia, dysuria, flank pain, frequency, genital sores, hematuria, menstrual problem, pelvic pain, urgency, vaginal bleeding, vaginal discharge and vaginal pain.  Musculoskeletal:  Negative for back pain, joint swelling and myalgias.  Skin:  Negative for rash.  Neurological:  Negative for dizziness, syncope, light-headedness, numbness and headaches.  Hematological:  Negative for adenopathy.  Psychiatric/Behavioral:  Negative for agitation, confusion, sleep disturbance and suicidal ideas. The patient is not nervous/anxious.  BREAST: No symptoms    Objective: BP 110/70    Ht 5\' 7"  (1.702 m)    Wt 153 lb (69.4 kg)    BMI 23.96 kg/m    Physical Exam Constitutional:      Appearance: She is well-developed.  Genitourinary:     Vulva normal.     Right Labia: No rash, tenderness or lesions.    Left Labia: No tenderness, lesions or rash.    No vaginal discharge, erythema or tenderness.      Right Adnexa: not tender and no mass present.    Left Adnexa: not tender and no mass present.    No cervical friability or polyp.     Uterus is not enlarged or tender.  Breasts:    Right: No mass, nipple discharge, skin change or tenderness.     Left: No mass, nipple discharge, skin change or tenderness.  Neck:     Thyroid: No thyromegaly.  Cardiovascular:     Rate and Rhythm: Normal rate and regular rhythm.     Heart sounds: Normal heart sounds. No murmur heard. Pulmonary:     Effort: Pulmonary effort is normal.     Breath sounds: Normal breath sounds.  Abdominal:     Palpations: Abdomen is soft.     Tenderness: There is no abdominal tenderness. There is no guarding or rebound.  Musculoskeletal:        General: Normal range of motion.     Cervical back: Normal range of motion.  Lymphadenopathy:     Cervical: No cervical adenopathy.   Neurological:     General: No focal deficit present.     Mental Status: She is alert and oriented to person, place, and time.     Cranial Nerves: No cranial nerve deficit.  Skin:    General: Skin is warm and dry.  Psychiatric:        Mood and Affect: Mood normal.        Behavior: Behavior normal.        Thought Content: Thought content normal.        Judgment: Judgment normal.  Vitals reviewed.    Assessment/Plan: Encounter for annual routine  gynecological examination  Encounter for screening mammogram for malignant neoplasm of breast; pt has mammo appt  Family history of breast cancer--MyRisk testing discussed and pt declines. F/u prn.   Vaginal atrophy--uses estrace crm prn, doesn't need RF currently.           GYN counsel breast self exam, mammography screening, use and side effects of HRT, menopause, adequate intake of calcium and vitamin D, diet and exercise    F/U  Return in about 1 year (around 06/26/2022).  Guerino Caporale B. Yesennia Hirota, PA-C 06/26/2021 1:45 PM

## 2021-06-26 NOTE — Patient Instructions (Signed)
I value your feedback and you entrusting us with your care. If you get a [facility] patient survey, I would appreciate you taking the time to let us know about your experience today. Thank you! ? ? ?

## 2021-07-11 ENCOUNTER — Other Ambulatory Visit: Payer: Self-pay | Admitting: Family Medicine

## 2021-07-11 DIAGNOSIS — N951 Menopausal and female climacteric states: Secondary | ICD-10-CM

## 2021-07-12 ENCOUNTER — Encounter: Payer: Self-pay | Admitting: Family Medicine

## 2021-07-12 DIAGNOSIS — J301 Allergic rhinitis due to pollen: Secondary | ICD-10-CM

## 2021-07-13 MED ORDER — CETIRIZINE HCL 10 MG PO TABS: 10.0000 mg | ORAL_TABLET | Freq: Every day | ORAL | 4 refills | Status: DC

## 2021-08-03 ENCOUNTER — Ambulatory Visit
Admission: RE | Admit: 2021-08-03 | Discharge: 2021-08-03 | Disposition: A | Discharge status: Home / Self Care | Payer: Managed Care, Other (non HMO) | Source: Ambulatory Visit | Attending: Family Medicine | Admitting: Family Medicine

## 2021-08-03 DIAGNOSIS — Z1231 Encounter for screening mammogram for malignant neoplasm of breast: Secondary | ICD-10-CM | POA: Insufficient documentation

## 2021-08-24 ENCOUNTER — Ambulatory Visit
Admission: RE | Admit: 2021-08-24 | Discharge: 2021-08-24 | Disposition: A | Discharge status: Home / Self Care | Payer: Managed Care, Other (non HMO) | Source: Ambulatory Visit | Attending: Physician Assistant | Admitting: Physician Assistant

## 2021-08-24 ENCOUNTER — Ambulatory Visit: Payer: Managed Care, Other (non HMO) | Admitting: Physician Assistant

## 2021-08-24 ENCOUNTER — Ambulatory Visit
Admission: RE | Admit: 2021-08-24 | Discharge: 2021-08-24 | Disposition: A | Discharge status: Home / Self Care | Payer: Managed Care, Other (non HMO) | Attending: Physician Assistant | Admitting: Physician Assistant

## 2021-08-24 ENCOUNTER — Encounter: Payer: Self-pay | Admitting: Physician Assistant

## 2021-08-24 VITALS — BP 135/84 | HR 85 | Temp 98.1°F | Resp 16 | Wt 155.0 lb

## 2021-08-24 DIAGNOSIS — G8929 Other chronic pain: Secondary | ICD-10-CM | POA: Insufficient documentation

## 2021-08-24 DIAGNOSIS — R102 Pelvic and perineal pain: Secondary | ICD-10-CM | POA: Diagnosis not present

## 2021-08-24 DIAGNOSIS — M5440 Lumbago with sciatica, unspecified side: Secondary | ICD-10-CM

## 2021-08-24 NOTE — Progress Notes (Signed)
?  ? ? ?I,Lavina Resor Robinson,acting as a Education administrator for [employer], PA-C.,have documented all relevant documentation on the behalf of Mardene Speak, PA-C,as directed by  [employer], PA-C while in the presence of [employer], PA-C. ? ?Established patient visit ? ? ?Patient: Rose Marsh   DOB: [birth year]   56 y.o. Female  MRN: [id number] ?Visit Date: 08/24/2021 ? ?Today's healthcare provider: Mardene Speak, PA-C  ? ?Chief Complaint  ?Patient presents with  ? Back Pain  ? ?Subjective  ?  ?HPI  ?Right hip / back pain that radiates to right pelvic area. ? ?She reports new onset of pain. It was not an injury that may have caused the pain. The pain started several months ago and is gradually worsening. The pain does radiate to right pelvic area , down right leg. The pain is described as throbbing, is moderate in intensity, occurring intermittently. Symptoms are worse in the: nighttime  ?She has tried NSAIDs with moderate relief -  "Arthritis NSAID ." ?Patient denies having recent trauma, bowel/bladder incontinence or urinary retention, weakness, falls, night pain, sweats, fever, weight loss, prolonged long-term glucocorticoid use. ? ?Right pelvic pain is similar to the pain she experienced before, when she had a "problem with an ovarian cyst." ?She has a hx of laparoscopic ovarian cystectomy. ? ?Medications: ?Outpatient Medications Prior to Visit  ?Medication Sig  ? cetirizine (ZYRTEC) 10 MG tablet Take 1 tablet (10 mg total) by mouth daily.  ? cloNIDine (CATAPRES) 0.1 MG tablet Take 1 tablet (0.1 mg total) by mouth daily.  ? hydrOXYzine (ATARAX/VISTARIL) 10 MG tablet TAKE 1 TABLET BY MOUTH AS DIRECTED AS NEEDED FOR INSOMNIA  ? PARoxetine (PAXIL) 10 MG tablet Take 1 tablet (10 mg total) by mouth daily.  ? terconazole (TERAZOL 7) 0.4 % vaginal cream Place 1 applicator vaginally at bedtime.  ? ?No facility-administered medications prior to visit.  ? ? ?Review of Systems  ?Respiratory: Negative.     ?Cardiovascular: Negative.   ?Genitourinary:  Positive for pelvic pain. Negative for decreased urine volume, frequency, hematuria, menstrual problem, urgency, vaginal bleeding, vaginal discharge and vaginal pain.  ?Musculoskeletal:  Positive for back pain.  ?Psychiatric/Behavioral:  Positive for dysphoric mood.   ? ?See HPI ? ?  Objective  ?  ?BP 135/84 (BP Location: Right Arm, Patient Position: Sitting, Cuff Size: Normal)   Pulse 85   Temp 98.1 ?F (36.7 ?C) (Oral)   Resp 16   Wt 155 lb (70.3 kg)   SpO2 100%   BMI 24.28 kg/m?  ? ? ?  06/01/2021  ?  9:05 AM 01/19/2021  ?  4:05 PM 05/03/2020  ?  3:22 PM 10/14/2017  ?  9:02 AM  ?Depression screen PHQ 2/9  ?Decreased Interest 0 0 0 0  ?Down, Depressed, Hopeless 0 0 0 0  ?PHQ - 2 Score 0 0 0 0  ?Altered sleeping 0 0 0 0  ?Tired, decreased energy 0 0 0 0  ?Change in appetite 0 0 0 0  ?Feeling bad or failure about yourself  0 0 0 0  ?Trouble concentrating 0 0 0 0  ?Moving slowly or fidgety/restless 0 0 0 0  ?Suicidal thoughts 0 0 0 0  ?PHQ-9 Score 0 0 0 0  ?Difficult doing work/chores Not difficult at all Not difficult at all Not difficult at all Not difficult at all  ? ? ?Physical Exam ?Vitals and nursing note reviewed.  ?Constitutional:   ?   Appearance: Normal appearance.  ?HENT:  ?  Head: Normocephalic and atraumatic.  ?Cardiovascular:  ?   Rate and Rhythm: Regular rhythm.  ?   Pulses: Normal pulses.  ?   Heart sounds: Normal heart sounds.  ?Pulmonary:  ?   Effort: Pulmonary effort is normal.  ?   Breath sounds: Normal breath sounds.  ?Musculoskeletal:     ?   General: Tenderness present.  ?   Right lower leg: No edema.  ?   Left lower leg: No edema.  ?   Comments: Negative SLT, positive crossed SLT(high specificity)  ?Neurological:  ?   Mental Status: She is alert and oriented to person, place, and time.  ?Psychiatric:     ?   Behavior: Behavior normal.     ?   Thought Content: Thought content normal.  ?  ? ? Assessment & Plan  ?  ? ?1. Chronic low back pain with  sciatica, sciatica laterality unspecified, unspecified back pain laterality ?Positive crossed SLT( 90% specificity) ?- DG Lumbar Spine Complete; Future ?- XR lumbar spine from 11/04/19 showed Degenerative changes at the L5-S1 level ?- Continue to use OTC pain medications and to add B vitamins: B1, B6, B12. ?- Might consider a referral to PT ? ?2. Pelvic pain ?Postmenopause ?Patient will schedule an appt with her OBGYN ? ?The patient was advised to call back or seek an in-person evaluation if the symptoms worsen or if the condition fails to improve as anticipated. ? ?I discussed the assessment and treatment plan with the patient. The patient was provided an opportunity to ask questions and all were answered. The patient agreed with the plan and demonstrated an understanding of the instructions. ? ?The entirety of the information documented in the History of Present Illness, Review of Systems and Physical Exam were personally obtained by me. Portions of this information were initially documented by the CMA and reviewed by me for thoroughness and accuracy.   ? ?Portions of this note were created using dictation software and may contain typographical errors.   ? ? ?Mardene Speak, PA-C  ?[facility] ?[phone] (phone) ?[phone] (fax) ? ?[facility] ?

## 2021-08-26 NOTE — Patient Instructions (Addendum)
Patient education ? ?Back pain is usually self-limited; treatment should relieve pain and improve function. ? ?Limit bed rest to <48 hours. ? ?Activity is encouraged as tolerated leads to quicker recovery; walk as much as possible. ? ?Early intervention with formal physical therapy  ? ?Sit on straight backed chair (not sofa or soft cushioned chair) to maintain lumbar lordosis. ? ?Strong evidence that intensive patient education is effective for long term pain control and ability to return to work  ?

## 2021-08-29 ENCOUNTER — Encounter: Payer: Self-pay | Admitting: Obstetrics and Gynecology

## 2021-08-29 ENCOUNTER — Ambulatory Visit: Payer: Managed Care, Other (non HMO) | Admitting: Obstetrics and Gynecology

## 2021-08-29 VITALS — BP 114/80 | Ht 67.0 in | Wt 154.0 lb

## 2021-08-29 DIAGNOSIS — R1031 Right lower quadrant pain: Secondary | ICD-10-CM | POA: Diagnosis not present

## 2021-08-29 NOTE — Patient Instructions (Signed)
I value your feedback and you entrusting us with your care. If you get a [facility] patient survey, I would appreciate you taking the time to let us know about your experience today. Thank you! ? ? ?

## 2021-08-29 NOTE — Progress Notes (Signed)
? ? ?Rose Sprout, FNP ? ? ?Chief Complaint  ?Patient presents with  ? Pelvic Pain  ?  Entire area on/off last 6 months  ? ? ?HPI: ?     [name] is a 56 y.o. [id number] whose LMP was No LMP recorded. Patient is postmenopausal., presents today for RLQ pain intermittently for past 6 months, sx increase past 2 months. Sx were episodic but lasting a few days in the past, now just daily. Can be sharp, achy, crampy. No aggrav factors, no allev factors. Hasn't tried NSAIDs or heating pad. Doesn't affect sleep. No GI sx, urin sx, vag sx. Hx of small leio in past, hx of ovar cyst yrs ago. No PMB. Vasomotor sx improved with paxil use now.  ?She is sex active, no pain/bleeding.  ?Having issues with LBP and had recent lumbar xrays showing Stable lower lumbar spondylosis and facet hypertrophy, greatest ?at L5-S1. Pt seeing PCP, going to get PT referral. Hasn't tried stretching/exercises for low back.  ? ?Patient Active Problem List  ? Diagnosis Date Noted  ? Family history of breast cancer 06/26/2021  ? Annual physical exam 06/01/2021  ? Elevated cholesterol 06/01/2021  ? Elevated red blood cell count 06/01/2021  ? Primary hypertension 06/01/2021  ? Breast pain, left 01/19/2021  ? DJD (degenerative joint disease), lumbosacral 01/19/2021  ? Screening mammogram, encounter for 01/19/2021  ? Low back pain 01/19/2021  ? Climacteric 11/12/2017  ? Hot flushes, perimenopausal 11/12/2017  ? Vaginal atrophy 11/12/2017  ? Dyspareunia in female 11/12/2017  ? ? ?Past Surgical History:  ?Procedure Laterality Date  ? LAPAROSCOPIC OVARIAN CYSTECTOMY Right   ? ? ?Family History  ?Problem Relation Age of Onset  ? Arthritis Mother   ? Rheum arthritis Mother   ? Brain cancer Father   ? Stroke Father   ? Breast cancer Sister   ?     11s  ? Bone cancer Paternal Grandmother   ? Colon cancer Neg Hx   ? ? ?Social History  ? ?Socioeconomic History  ? Marital status: Married  ?  Spouse name: Not on file  ? Number of children: Not on  file  ? Years of education: Not on file  ? Highest education level: Not on file  ?Occupational History  ? Not on file  ?Tobacco Use  ? Smoking status: Never  ? Smokeless tobacco: Never  ?Vaping Use  ? Vaping Use: Never used  ?Substance and Sexual Activity  ? Alcohol use: Yes  ?  Comment: infrequently  ? Drug use: Never  ? Sexual activity: Yes  ?  Birth control/protection: None  ?Other Topics Concern  ? Not on file  ?Social History Narrative  ? Not on file  ? ?Social Determinants of Health  ? ?Financial Resource Strain: Not on file  ?Food Insecurity: Not on file  ?Transportation Needs: Not on file  ?Physical Activity: Not on file  ?Stress: Not on file  ?Social Connections: Not on file  ?Intimate Partner Violence: Not on file  ? ? ?Outpatient Medications Prior to Visit  ?Medication Sig Dispense Refill  ? cloNIDine (CATAPRES) 0.1 MG tablet Take 1 tablet (0.1 mg total) by mouth daily. 90 tablet 3  ? hydrOXYzine (ATARAX/VISTARIL) 10 MG tablet TAKE 1 TABLET BY MOUTH AS DIRECTED AS NEEDED FOR INSOMNIA 90 tablet 3  ? PARoxetine (PAXIL) 10 MG tablet Take 1 tablet (10 mg total) by mouth daily. 90 tablet 1  ? terconazole (TERAZOL 7) 0.4 % vaginal cream Place 1  applicator vaginally at bedtime. 45 g 0  ? cetirizine (ZYRTEC) 10 MG tablet Take 1 tablet (10 mg total) by mouth daily. 90 tablet 4  ? ?No facility-administered medications prior to visit.  ? ? ? ? ?ROS: ? ?Review of Systems  ?Constitutional:  Negative for fever.  ?Gastrointestinal:  Negative for blood in stool, constipation, diarrhea, nausea and vomiting.  ?Genitourinary:  Positive for pelvic pain. Negative for dyspareunia, dysuria, flank pain, frequency, hematuria, urgency, vaginal bleeding, vaginal discharge and vaginal pain.  ?Musculoskeletal:  Positive for back pain.  ?Skin:  Negative for rash.  ?BREAST: No symptoms ? ? ?OBJECTIVE:  ? ?Vitals:  ?BP 114/80   Ht '5\' 7"'$  (1.702 m)   Wt 154 lb (69.9 kg)   BMI 24.12 kg/m?  ? ?Physical Exam ?Vitals reviewed.   ?Constitutional:   ?   Appearance: She is well-developed.  ?Pulmonary:  ?   Effort: Pulmonary effort is normal.  ?Abdominal:  ?   Palpations: Abdomen is soft.  ?   Tenderness: There is abdominal tenderness in the right lower quadrant. There is no guarding or rebound.  ? ? ?Genitourinary: ?   General: Normal vulva.  ?   Pubic Area: No rash.   ?   Labia:     ?   Right: No rash, tenderness or lesion.     ?   Left: No rash, tenderness or lesion.   ?   Vagina: Normal. No vaginal discharge, erythema or tenderness.  ?   Cervix: Normal.  ?   Uterus: Normal. Not enlarged and not tender.   ?   Adnexa: Left adnexa normal.    ?   Right: Tenderness present. No mass.      ?   Left: No mass or tenderness.    ?Musculoskeletal:     ?   General: Normal range of motion.  ?   Cervical back: Normal range of motion.  ?Skin: ?   General: Skin is warm and dry.  ?Neurological:  ?   General: No focal deficit present.  ?   Mental Status: She is alert and oriented to person, place, and time.  ?Psychiatric:     ?   Mood and Affect: Mood normal.     ?   Behavior: Behavior normal.     ?   Thought Content: Thought content normal.     ?   Judgment: Judgment normal.  ? ? ?Assessment/Plan: ?RLQ abdominal pain - Plan: US PELVIS TRANSVAGINAL NON-OB (TV ONLY); intermittent for 6 months, sx increased for 2 months. Tender to palpate near pubic bone. Check GYN u/s, will f/u with results. If neg, sx most likely MSK. Discussed PT, stretching, heat, Low back exercises.  ? ? ? Return in about 2 weeks (around 09/12/2021) for GYN u/s at [facility] for RLQ pain--[facility] to call pt. ? ?Rose Marsh B. Evalyne Cortopassi, PA-C ?08/29/2021 ?3:02 PM ? ? ? ? ? ?

## 2021-09-03 ENCOUNTER — Other Ambulatory Visit: Payer: Self-pay | Admitting: Family Medicine

## 2021-09-13 ENCOUNTER — Telehealth: Payer: Self-pay | Admitting: Obstetrics and Gynecology

## 2021-09-13 ENCOUNTER — Other Ambulatory Visit: Payer: Self-pay | Admitting: Obstetrics and Gynecology

## 2021-09-13 ENCOUNTER — Ambulatory Visit (INDEPENDENT_AMBULATORY_CARE_PROVIDER_SITE_OTHER): Payer: Managed Care, Other (non HMO)

## 2021-09-13 DIAGNOSIS — R1031 Right lower quadrant pain: Secondary | ICD-10-CM | POA: Diagnosis not present

## 2021-09-13 DIAGNOSIS — N950 Postmenopausal bleeding: Secondary | ICD-10-CM

## 2021-09-13 NOTE — Telephone Encounter (Signed)
LM with results. Pt with RLQ pain, no GYN cause for pain. Most likely MSK per my note.  ?Pt with several small stable leio but EM=29 mm. Pt didn't have PMB when I saw her 4/23 but ultrasonographer states pt told her she did have some in last month or so. Pt hadn't had a period in over a yr a couple yrs ago when started on HRT. Pt then did have some bleeding with HRT but EM=4.8 mm on GYN u/s and sx resolved. Pt no longer on HRT. Asked pt to call to clarify this info. If has had bleeding, then needs EMB.  ?

## 2021-09-14 ENCOUNTER — Ambulatory Visit: Payer: Managed Care, Other (non HMO) | Admitting: Physician Assistant

## 2021-09-14 ENCOUNTER — Encounter: Payer: Self-pay | Admitting: Physician Assistant

## 2021-09-14 VITALS — BP 148/100 | HR 78 | Resp 16 | Wt 154.0 lb

## 2021-09-14 DIAGNOSIS — R102 Pelvic and perineal pain: Secondary | ICD-10-CM | POA: Diagnosis not present

## 2021-09-14 DIAGNOSIS — I10 Essential (primary) hypertension: Secondary | ICD-10-CM

## 2021-09-14 DIAGNOSIS — G8929 Other chronic pain: Secondary | ICD-10-CM

## 2021-09-14 DIAGNOSIS — M5440 Lumbago with sciatica, unspecified side: Secondary | ICD-10-CM

## 2021-09-14 NOTE — Assessment & Plan Note (Signed)
Chronic and stable today ?Continue to use heat, topical antiinflammatory ?A referral to PT was placed today. ?

## 2021-09-14 NOTE — Assessment & Plan Note (Signed)
BP today was 148/100. However, patient argues that her BP at home around 135/87. Encouraged to measure her BP at home and record her findings, bring the BP log with her to the next appt with Daneil Dan ?Continue her current regimen ?Continue low-salt diet and exercise ?

## 2021-09-14 NOTE — Progress Notes (Signed)
?  ? ? ?Established patient visit ? ?I,Rose Marsh,acting as a Education administrator for [employer], PA-C.,have documented all relevant documentation on the behalf of Mardene Speak, PA-C,as directed by  [employer], PA-C while in the presence of [employer], PA-C. ? ? ?Patient: Rose Marsh   DOB: Aug 08, 1965   56 y.o. Female  MRN: [id number] ?Visit Date: 09/14/2021 ? ?Today's healthcare provider: Mardene Speak, PA-C  ? ?Chief Complaint  ?Patient presents with  ? Follow-up  ? ?Subjective  ?  ?HPI  ?Patient is here for 1 week follow up. ? ?Medications: ?Outpatient Medications Prior to Visit  ?Medication Sig  ? cloNIDine (CATAPRES) 0.1 MG tablet Take 1 tablet (0.1 mg total) by mouth daily.  ? hydrOXYzine (ATARAX/VISTARIL) 10 MG tablet TAKE 1 TABLET BY MOUTH AS DIRECTED AS NEEDED FOR INSOMNIA  ? PARoxetine (PAXIL) 10 MG tablet TAKE 1 TABLET BY MOUTH EVERY DAY  ? terconazole (TERAZOL 7) 0.4 % vaginal cream Place 1 applicator vaginally at bedtime.  ? ?No facility-administered medications prior to visit.  ? ? ?Review of Systems  ?Constitutional: Negative.   ?HENT: Negative.    ?Respiratory: Negative.    ?Cardiovascular: Negative.   ?Gastrointestinal: Negative.   ? ?  Objective  ?  ?BP (!) 148/100 (BP Location: Right Arm, Patient Position: Sitting, Cuff Size: Normal)   Pulse 78   Resp 16   Wt 154 lb (69.9 kg)   SpO2 100%   BMI 24.12 kg/m?  ? ? ?Physical Exam ?Vitals reviewed.  ?Constitutional:   ?   Appearance: Normal appearance. She is normal weight.  ?HENT:  ?   Head: Normocephalic and atraumatic.  ?Eyes:  ?   Extraocular Movements: Extraocular movements intact.  ?   Conjunctiva/sclera: Conjunctivae normal.  ?Pulmonary:  ?   Effort: Pulmonary effort is normal.  ?Abdominal:  ?   General: Abdomen is flat.  ?Neurological:  ?   Mental Status: She is alert and oriented to person, place, and time.  ?   Sensory: No sensory deficit.  ?   Motor: No weakness.  ?   Coordination: Coordination normal.  ?   Gait: Gait  normal.  ?   Deep Tendon Reflexes: Reflexes normal.  ?Psychiatric:     ?   Behavior: Behavior normal.     ?   Thought Content: Thought content normal.     ?   Judgment: Judgment normal.  ?  ? ? ?No results found for any visits on 09/14/21. ? Assessment & Plan  ?  ? ?Problem List Items Addressed This Visit   ? ?  ? Cardiovascular and Mediastinum  ? Primary hypertension  ?  BP today was 148/100. However, patient argues that her BP at home around 135/87. Encouraged to measure her BP at home and record her findings, bring the BP log with her to the next appt with Daneil Dan ?Continue her current regimen ?Continue low-salt diet and exercise ? ?  ?  ?  ? Other  ? Low back pain - Primary  ?  Chronic and stable today ?Continue to use heat, topical antiinflammatory ?A referral to PT was placed today. ? ?  ?  ? Relevant Orders  ? Ambulatory referral to Physical Therapy  ? Pelvic pain  ?  Managed by [facility]. She saw ObGyn on 08/29/21 and was scheduled for TV US. Was discussed " If neg, sx most likely MSK."  ?Per chart review,TV US on 09/13/21 showed " several small stable leio but EM=29 mm. " If  patient has been having PMB, she might need EMB.  ?Patient is not on HRT ? ? ?  ?  ? ? ? ?FU with her PCP, Tally Joe ?  ? ?The patient was advised to call back or seek an in-person evaluation if the symptoms worsen or if the condition fails to improve as anticipated. ? ?I discussed the assessment and treatment plan with the patient. The patient was provided an opportunity to ask questions and all were answered. The patient agreed with the plan and demonstrated an understanding of the instructions. ? ?The entirety of the information documented in the History of Present Illness, Review of Systems and Physical Exam were personally obtained by me. Portions of this information were initially documented by the CMA and reviewed by me for thoroughness and accuracy.  ?Portions of this note were created using dictation software and may contain  typographical errors.  ? ?Mardene Speak, PA-C  ?[facility] ?[phone] (phone) ?[phone] (fax) ? ?[facility] ?

## 2021-09-14 NOTE — Assessment & Plan Note (Addendum)
Managed by [facility]. She saw ObGyn on 08/29/21 and was scheduled for TV US. Was discussed " If neg, sx most likely MSK."  ?Per chart review,TV US on 09/13/21 showed " several small stable leio but EM=29 mm. " If patient has been having PMB, she might need EMB.  ?Patient is not on HRT ? ?

## 2021-09-16 ENCOUNTER — Encounter: Payer: Self-pay | Admitting: Family Medicine

## 2021-09-16 ENCOUNTER — Encounter: Payer: Self-pay | Admitting: Obstetrics and Gynecology

## 2021-09-25 ENCOUNTER — Other Ambulatory Visit: Payer: Self-pay | Admitting: Family Medicine

## 2021-09-28 ENCOUNTER — Ambulatory Visit: Payer: Managed Care, Other (non HMO) | Admitting: Family Medicine

## 2021-09-28 ENCOUNTER — Encounter: Payer: Self-pay | Admitting: Family Medicine

## 2021-09-28 VITALS — BP 155/99 | HR 67 | Temp 97.7°F | Resp 16 | Wt 155.0 lb

## 2021-09-28 DIAGNOSIS — M5441 Lumbago with sciatica, right side: Secondary | ICD-10-CM

## 2021-09-28 DIAGNOSIS — I10 Essential (primary) hypertension: Secondary | ICD-10-CM

## 2021-09-28 DIAGNOSIS — G8929 Other chronic pain: Secondary | ICD-10-CM | POA: Diagnosis not present

## 2021-09-28 MED ORDER — PAROXETINE HCL 10 MG PO TABS: 5.0000 mg | ORAL_TABLET | Freq: Every day | ORAL | 2 refills | Status: DC

## 2021-09-28 MED ORDER — CYCLOBENZAPRINE HCL 5 MG PO TABS: 5.0000 mg | ORAL_TABLET | Freq: Every day | ORAL | 1 refills | Status: DC

## 2021-09-28 MED ORDER — MELOXICAM 15 MG PO TABS: 15.0000 mg | ORAL_TABLET | Freq: Every day | ORAL | 0 refills | Status: DC

## 2021-09-28 NOTE — Progress Notes (Signed)
Established patient visit   Patient: Rose Marsh   DOB: 11/08/1965   56 y.o. Female  MRN: [id number] Visit Date: 09/28/2021  Today's healthcare provider: [name], FNP  Introduced to nurse practitioner role and practice setting.  All questions answered.  Discussed provider/patient relationship and expectations.  Chief Complaint  Patient presents with   Follow-up   Hypertension   Subjective    HPI  Hypertension, follow-up  BP Readings from Last 3 Encounters:  09/28/21 (!) 155/99  09/14/21 (!) 148/100  08/29/21 114/80   Wt Readings from Last 3 Encounters:  09/28/21 155 lb (70.3 kg)  09/14/21 154 lb (69.9 kg)  08/29/21 154 lb (69.9 kg)     She was last seen for hypertension 2 months ago.       Management since that visit includes patient advised to bring bp readings from home.  Outside blood pressures are 135/87.  Pertinent labs Lab Results  Component Value Date   CHOL 214 (H) 06/01/2021   HDL 89 06/01/2021   LDLCALC 114 (H) 06/01/2021   TRIG 63 06/01/2021   CHOLHDL 2.4 06/01/2021   Lab Results  Component Value Date   NA 142 06/01/2021   K 4.3 06/01/2021   CREATININE 0.78 06/01/2021   EGFR 89 06/01/2021   GLUCOSE 82 06/01/2021   TSH 1.210 05/03/2020     The 10-year ASCVD risk score (Arnett DK, et al., 2019) is: 3.4%  ---------------------------------------------------------------------------------------------------   Medications: Outpatient Medications Prior to Visit  Medication Sig   cloNIDine (CATAPRES) 0.1 MG tablet Take 1 tablet (0.1 mg total) by mouth daily.   hydrOXYzine (ATARAX/VISTARIL) 10 MG tablet TAKE 1 TABLET BY MOUTH AS DIRECTED AS NEEDED FOR INSOMNIA   terconazole (TERAZOL 7) 0.4 % vaginal cream Place 1 applicator vaginally at bedtime.   [DISCONTINUED] PARoxetine (PAXIL) 10 MG tablet TAKE 1 TABLET BY MOUTH EVERY DAY   No facility-administered medications prior to visit.    Review of Systems  Constitutional:   Negative for appetite change, chills, fatigue and fever.  Respiratory:  Negative for chest tightness and shortness of breath.   Cardiovascular:  Negative for chest pain and palpitations.  Gastrointestinal:  Negative for abdominal pain, nausea and vomiting.  Neurological:  Negative for dizziness and weakness.      Objective    BP (!) 155/99 (BP Location: Right Arm, Patient Position: Sitting, Cuff Size: Normal)   Pulse 67   Temp 97.7 F (36.5 C) (Temporal)   Resp 16   Wt 155 lb (70.3 kg)   SpO2 98%   BMI 24.28 kg/m    Physical Exam Vitals and nursing note reviewed.  Constitutional:      General: She is not in acute distress.    Appearance: Normal appearance. She is normal weight. She is not ill-appearing, toxic-appearing or diaphoretic.  HENT:     Head: Normocephalic and atraumatic.  Cardiovascular:     Rate and Rhythm: Normal rate and regular rhythm.     Pulses: Normal pulses.     Heart sounds: Normal heart sounds. No murmur heard.   No friction rub. No gallop.  Pulmonary:     Effort: Pulmonary effort is normal. No respiratory distress.     Breath sounds: Normal breath sounds. No stridor. No wheezing, rhonchi or rales.  Chest:     Chest wall: No tenderness.  Abdominal:     General: Bowel sounds are normal.     Palpations: Abdomen is soft.  Musculoskeletal:  General: No swelling, tenderness, deformity or signs of injury. Normal range of motion.     Right lower leg: No edema.     Left lower leg: No edema.  Skin:    General: Skin is warm and dry.     Capillary Refill: Capillary refill takes less than 2 seconds.     Coloration: Skin is not jaundiced or pale.     Findings: No bruising, erythema, lesion or rash.  Neurological:     General: No focal deficit present.     Mental Status: She is alert and oriented to person, place, and time. Mental status is at baseline.     Cranial Nerves: No cranial nerve deficit.     Sensory: No sensory deficit.     Motor: No  weakness.     Coordination: Coordination normal.  Psychiatric:        Mood and Affect: Mood normal.        Behavior: Behavior normal.        Thought Content: Thought content normal.        Judgment: Judgment normal.     No results found for any visits on 09/28/21.  Assessment & Plan     Problem List Items Addressed This Visit       Cardiovascular and Mediastinum   Primary hypertension    Chronic, previously stable; recent elevation in last month- likely due to lower back pain; goal of <140/<90 Denies CP Denies SOB/ DOE Denies low blood pressure/hypotension Denies vision changes No LE Edema noted on exam Continue medication, clonidine 0.1 mg QD Denies side effects RTC 1 month with home logs and machine Seek emergent care if you develop chest pain or chest pressure         Nervous and Auditory   Chronic right-sided low back pain with right-sided sciatica - Primary    Acute on chronic, variable Plans to start PT Will use mobic x1 month  Encourage stretching  Encourage flexeril low dose qHS to assist RTC 1 month Likely pain is worsening HTN; encourage home checks- goal <140/<90       Relevant Medications   meloxicam (MOBIC) 15 MG tablet   cyclobenzaprine (FLEXERIL) 5 MG tablet   PARoxetine (PAXIL) 10 MG tablet     Return in about 4 weeks (around 10/26/2021) for HTN management.      Vonna Kotyk, FNP, have reviewed all documentation for this visit. The documentation on 09/28/21 for the exam, diagnosis, procedures, and orders are all accurate and complete.    [name], [place] [phone] (phone) [phone] (fax)  [place]

## 2021-09-28 NOTE — Assessment & Plan Note (Signed)
Acute on chronic, variable Plans to start PT Will use mobic x1 month  Encourage stretching  Encourage flexeril low dose qHS to assist RTC 1 month Likely pain is worsening HTN; encourage home checks- goal <140/<90

## 2021-09-28 NOTE — Patient Instructions (Signed)
The 10-year ASCVD risk score (Arnett DK, et al., 2019) is: 3.4%   Values used to calculate the score:     Age: 56 years     Sex: Female     Is Non-Hispanic African American: No     Diabetic: No     Tobacco smoker: No     Systolic Blood Pressure: 890 mmHg     Is BP treated: Yes     HDL Cholesterol: 89 mg/dL     Total Cholesterol: 214 mg/dL

## 2021-09-28 NOTE — Assessment & Plan Note (Signed)
Chronic, previously stable; recent elevation in last month- likely due to lower back pain; goal of <140/<90 Denies CP Denies SOB/ DOE Denies low blood pressure/hypotension Denies vision changes No LE Edema noted on exam Continue medication, clonidine 0.1 mg QD Denies side effects RTC 1 month with home logs and machine Seek emergent care if you develop chest pain or chest pressure

## 2021-10-16 ENCOUNTER — Encounter: Payer: Self-pay | Admitting: Physical Therapy

## 2021-10-16 ENCOUNTER — Other Ambulatory Visit: Payer: Self-pay

## 2021-10-16 ENCOUNTER — Telehealth: Payer: Self-pay | Admitting: Physical Therapy

## 2021-10-16 ENCOUNTER — Ambulatory Visit: Payer: Managed Care, Other (non HMO) | Attending: Physician Assistant | Admitting: Physical Therapy

## 2021-10-16 DIAGNOSIS — M5459 Other low back pain: Secondary | ICD-10-CM | POA: Diagnosis present

## 2021-10-16 DIAGNOSIS — M5440 Lumbago with sciatica, unspecified side: Secondary | ICD-10-CM | POA: Insufficient documentation

## 2021-10-16 DIAGNOSIS — G8929 Other chronic pain: Secondary | ICD-10-CM | POA: Insufficient documentation

## 2021-10-16 NOTE — Therapy (Unsigned)
OUTPATIENT PHYSICAL THERAPY THORACOLUMBAR EVALUATION   Patient Name: Rose Marsh MRN: [id number] DOB:Jul 22, 1965, 56 y.o., female Today's Date: 10/17/2021   PT End of Session - 10/16/21 1715     Visit Number 1    Number of Visits 16    Date for PT Re-Evaluation 12/12/21    Authorization Type [employer]    Authorization Time Period PT/OT 30 per year    Authorization - Visit Number 1    Authorization - Number of Visits 30    Progress Note Due on Visit 10    PT Start Time 6063    PT Stop Time 1800    PT Time Calculation (min) 45 min    Activity Tolerance Patient tolerated treatment well    Behavior During Therapy WFL for tasks assessed/performed             Past Medical History:  Diagnosis Date   BV (bacterial vaginosis)    Hypertension    Ovarian cyst    Vasomotor symptoms due to menopause    Past Surgical History:  Procedure Laterality Date   LAPAROSCOPIC OVARIAN CYSTECTOMY Right    Patient Active Problem List   Diagnosis Date Noted   Chronic right-sided low back pain with right-sided sciatica 09/28/2021   Pelvic pain 09/14/2021   Family history of breast cancer 06/26/2021   Annual physical exam 06/01/2021   Elevated cholesterol 06/01/2021   Elevated red blood cell count 06/01/2021   Primary hypertension 06/01/2021   Breast pain, left 01/19/2021   DJD (degenerative joint disease), lumbosacral 01/19/2021   Screening mammogram, encounter for 01/19/2021   Low back pain 01/19/2021   Climacteric 11/12/2017   Hot flushes, perimenopausal 11/12/2017   Vaginal atrophy 11/12/2017   Dyspareunia in female 11/12/2017    PCP: Tally Joe FNP   REFERRING PROVIDER: Mardene Speak PA-C   REFERRING DIAG: M54.40,G89.29 (ICD-10-CM) - Chronic low back pain with sciatica, sciatica laterality unspecified, unspecified back pain laterality  Rationale for Evaluation and Treatment Rehabilitation  THERAPY DIAG:  Other low back pain - Plan: PT plan of care  cert/re-cert  ONSET DATE: 0/1/60  SUBJECTIVE:                                                                                                                                                                                           SUBJECTIVE STATEMENT: Pt reports that pain started in her right hip and that she went to doctor and they reported it was arthritis. The pain has now spread across right side of low back to left side.    PERTINENT HISTORY:  Per Janna Ostwalt's note on 09/14/21  Low back pain - Primary       Chronic and stable today Continue to use heat, topical antiinflammatory A referral to PT was placed today.    PAIN:  Are you having pain? Yes: NPRS scale: 4 to 5/10 Pain location: Right side of low back pain and right hip and right groin  Pain description: Achy  Aggravating factors: Sitting to long  Relieving factors: Topical cream makes it feel better (Voltaren)   PRECAUTIONS: None  WEIGHT BEARING RESTRICTIONS No  FALLS:  Has patient fallen in last 6 months? No  LIVING ENVIRONMENT: Lives with: lives with their family (husband) Lives in: House/apartment Stairs: Yes: Internal: 13 steps; on right going up and on left going up Has following equipment at home: None  OCCUPATION: Holiday representative; sitting a lot of the day   PLOF: Independent  PATIENT GOALS Decrease low back and right hip pain    OBJECTIVE:              VITALS:  BP 165/95 HR 66 SpO2 100   DIAGNOSTIC FINDINGS:  CLINICAL DATA:  Back pain for 6 months   EXAM: LUMBAR SPINE - COMPLETE 4+ VIEW   COMPARISON:  11/04/2019   FINDINGS: Frontal, bilateral oblique, lateral views of the lumbar spine are obtained. There are 5 non-rib-bearing lumbar type vertebral bodies identified in stable alignment. No acute fractures. There is stable moderate spondylosis at L5-S1. Marked facet hypertrophic changes from L3-4 through L5-S1 are also stable. Remaining disc spaces are well preserved.  Sacroiliac joints are unremarkable.   IMPRESSION: 1. Stable lower lumbar spondylosis and facet hypertrophy, greatest at L5-S1.   PATIENT SURVEYS:  FOTO 83/100 (with 83% predicted)  SCREENING FOR RED FLAGS: Bowel or bladder incontinence: No Spinal tumors: No Cauda equina syndrome: No Compression fracture: No Abdominal aneurysm: No  COGNITION:  Overall cognitive status: Within functional limits for tasks assessed     SENSATION: WFL  MUSCLE LENGTH: Hamstrings: Right 90 deg; Left 90 deg Thomas test: Negative bilateral   POSTURE: weight shift left  PALPATION: Pain on on right spinous process with hip extension and hip internal rotation   Left hip flexor   LUMBAR ROM:   Active  A/PROM  eval  Flexion 100%  Extension 100%*  Right lateral flexion 100%  Left lateral flexion 100%  Right rotation 100%  Left rotation 100%   (Blank rows = not tested)  LOWER EXTREMITY ROM:      Active  Right 10/16/2021 Left 10/16/2021  Hip flexion 120 120  Hip extension 30 30  Hip abduction 45 45  Hip adduction 30 30  Hip internal rotation 45 45  Hip external rotation 45 45  Knee flexion 135 135  Knee extension 0 0  Ankle dorsiflexion 20 20  Ankle plantarflexion 50 50  Ankle inversion    Ankle eversion     (Blank rows = not tested)     LOWER EXTREMITY MMT:    MMT Right eval Left eval  Hip flexion 5 5  Hip extension 4 4  Hip abduction 5 5  Hip adduction 4 4  Hip internal rotation    Hip external rotation    Knee flexion 5 5  Knee extension 5 5  Ankle dorsiflexion 5 5  Ankle plantarflexion    Ankle inversion    Ankle eversion     (Blank rows = not tested)  LUMBAR SPECIAL TESTS:  Straight leg raise test: Negative, FABER test: Negative, and FADIR Negative   FUNCTIONAL TESTS:  None performed   GAIT: Distance walked: 25 ft  Assistive device utilized: None Level of assistance: Complete Independence Comments: No deficits detected     TODAY'S TREATMENT   Lower Trunk Rotation 3 x 10     PATIENT EDUCATION:  Education details: Explanation of plan of care. Form and technique for appropriate exercise.  Person educated: Patient Education method: Explanation, Demonstration, Verbal cues, and Handouts Education comprehension: verbalized understanding, returned demonstration, and verbal cues required   HOME EXERCISE PROGRAM: Access Code: [email or web address] URL: [email or web address] Date: 10/16/2021 Prepared by: Bradly Chris  Exercises - Supine Lower Trunk Rotation  - 1 x daily - 7 x weekly - 3 sets - 10 reps - Prone Quadriceps Stretch with Strap  - 1 x daily - 7 x weekly - 1 sets - 3 reps - 30 hold  ASSESSMENT:  CLINICAL IMPRESSION: Patient is a 56 y.o. AA female who was seen today for physical therapy evaluation and treatment for unspecified low back pain. Pt exhibits stable symptoms, moderate to low pain and moderate disability that make her appropriate for movement control treatment based classification group. Lumbar radiculopathy ruled out during evaluation. Pt shows deficits that include hip weakness and hip inflexibility as well as low back pain with activity and with prolonged sitting. 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    OBJECTIVE IMPAIRMENTS decreased strength, impaired flexibility, and pain.   ACTIVITY LIMITATIONS carrying, lifting, bending, sitting, and standing  PARTICIPATION LIMITATIONS: driving and occupation  PERSONAL FACTORS Time since onset of injury/illness/exacerbation and 1-2 comorbidities: DDD and hypertension  are also affecting patient's functional outcome.   REHAB POTENTIAL: Good  CLINICAL DECISION MAKING: Stable/uncomplicated  EVALUATION COMPLEXITY: Low   GOALS: Goals reviewed with patient? No  SHORT TERM GOALS: Target  date: 10/31/2021  Pt will be independent with HEP in order to improve strength and balance in order to decrease fall risk and improve function at home and work. Baseline: NT Goal status: INITIAL   LONG TERM GOALS: Target date: 12/12/2021  Patient will have improved function and activity level as evidenced by an increase in FOTO score by 10 points or more.  Baseline:  Goal status: INITIAL  2.  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  Baseline: Hip R/L Add 4/4, Ext 4/4 Goal status: INITIAL  3.  Patient will demonstrate improved core strength as evidenced by maintaining 40 mmHg pressure during stabilizer biofeedback measurement of TA in supine and with leg loading.  Baseline: Not performed  Goal status: INITIAL    PLAN: PT FREQUENCY: 1-2x/week  PT DURATION: 8 weeks  PLANNED INTERVENTIONS: Therapeutic exercises, Therapeutic activity, Neuromuscular re-education, Balance training, Gait training, Patient/Family education, Joint manipulation, Joint mobilization, Dry Needling, Electrical stimulation, Spinal manipulation, Spinal mobilization, Cryotherapy, Moist heat, Biofeedback, Manual therapy, and Re-evaluation.  PLAN FOR NEXT SESSION: Finish hip strength testing, test TA in supine and leg loading using stabilizer, progress hip and lumbar strengthening exercises   Bradly Chris PT, DPT  10/17/2021, 2:50 PM

## 2021-10-16 NOTE — Telephone Encounter (Signed)
Called pt to inquiry about whether she would like to move PT initial eval up to earlier slot. Pt stated she would like to take 5:15 slot.

## 2021-10-17 ENCOUNTER — Ambulatory Visit: Payer: Managed Care, Other (non HMO) | Admitting: Physical Therapy

## 2021-10-22 ENCOUNTER — Encounter: Payer: Managed Care, Other (non HMO) | Admitting: Physical Therapy

## 2021-10-24 ENCOUNTER — Encounter: Payer: Self-pay | Admitting: Physical Therapy

## 2021-10-24 ENCOUNTER — Ambulatory Visit: Payer: Managed Care, Other (non HMO) | Admitting: Physical Therapy

## 2021-10-24 DIAGNOSIS — M5459 Other low back pain: Secondary | ICD-10-CM | POA: Diagnosis not present

## 2021-10-24 NOTE — Therapy (Signed)
OUTPATIENT PHYSICAL THERAPY TREATMENT NOTE   Patient Name: Rose Marsh MRN: [id number] DOB:12/09/1965, 56 y.o., female Today's Date: 10/24/2021  PCP: Tally Joe FNP  REFERRING PROVIDER: Mardene Speak PA-C   END OF SESSION:   PT End of Session - 10/24/21 1511     Visit Number 2    Number of Visits 16    Date for PT Re-Evaluation 12/12/21    Authorization Type [employer]    Authorization Time Period PT/OT 30 per year    Authorization - Visit Number 2    Authorization - Number of Visits 30    Progress Note Due on Visit 10    PT Start Time 1505    PT Stop Time 1545    PT Time Calculation (min) 40 min    Activity Tolerance Patient tolerated treatment well    Behavior During Therapy WFL for tasks assessed/performed             Past Medical History:  Diagnosis Date   BV (bacterial vaginosis)    Hypertension    Ovarian cyst    Vasomotor symptoms due to menopause    Past Surgical History:  Procedure Laterality Date   LAPAROSCOPIC OVARIAN CYSTECTOMY Right    Patient Active Problem List   Diagnosis Date Noted   Chronic right-sided low back pain with right-sided sciatica 09/28/2021   Pelvic pain 09/14/2021   Family history of breast cancer 06/26/2021   Annual physical exam 06/01/2021   Elevated cholesterol 06/01/2021   Elevated red blood cell count 06/01/2021   Primary hypertension 06/01/2021   Breast pain, left 01/19/2021   DJD (degenerative joint disease), lumbosacral 01/19/2021   Screening mammogram, encounter for 01/19/2021   Low back pain 01/19/2021   Climacteric 11/12/2017   Hot flushes, perimenopausal 11/12/2017   Vaginal atrophy 11/12/2017   Dyspareunia in female 11/12/2017    REFERRING DIAG:   M54.40,G89.29 (ICD-10-CM) - Chronic low back pain with sciatica, sciatica laterality unspecified, unspecified back pain laterality  THERAPY DIAG:  Other low back pain  Rationale for Evaluation and Treatment Rehabilitation  PERTINENT HISTORY: Per  Janna Ostwalt's note on 09/14/21     Low back pain - Primary                               Chronic and stable today Continue to use heat, topical antiinflammatory A referral to PT was placed today.    PRECAUTIONS: None   SUBJECTIVE: Patient reports worsening pain since last session and she attributes this to the rainy weather.   PAIN:  Are you having pain? Yes: NPRS scale: 7/10 Pain location: Right sided low back pain and thoracic spine  Pain description: Achy with burning pain in the right hip down the leg  Aggravating factors: sitting for long periods of time  Relieving factors: standing up or laying down    OBJECTIVE: (objective measures completed at initial evaluation unless otherwise dated)  OBJECTIVE:              VITALS:  BP 165/95 HR 66 SpO2 100    DIAGNOSTIC FINDINGS:  CLINICAL DATA:  Back pain for 6 months   EXAM: LUMBAR SPINE - COMPLETE 4+ VIEW   COMPARISON:  11/04/2019   FINDINGS: Frontal, bilateral oblique, lateral views of the lumbar spine are obtained. There are 5 non-rib-bearing lumbar type vertebral bodies identified in stable alignment. No acute fractures. There is stable moderate spondylosis at L5-S1. Marked facet hypertrophic changes  from L3-4 through L5-S1 are also stable. Remaining disc spaces are well preserved. Sacroiliac joints are unremarkable.   IMPRESSION: 1. Stable lower lumbar spondylosis and facet hypertrophy, greatest at L5-S1.     PATIENT SURVEYS:  FOTO 83/100 (with 83% predicted)   SCREENING FOR RED FLAGS: Bowel or bladder incontinence: No Spinal tumors: No Cauda equina syndrome: No Compression fracture: No Abdominal aneurysm: No   COGNITION:           Overall cognitive status: Within functional limits for tasks assessed                          SENSATION: WFL   MUSCLE LENGTH: Hamstrings: Right 90 deg; Left 90 deg Thomas test: Negative bilateral    POSTURE: weight shift left   PALPATION: Pain on on right spinous  process with hip extension and hip internal rotation   Left hip flexor    LUMBAR ROM:    Active  A/PROM  eval  Flexion 100%  Extension 100%*  Right lateral flexion 100%  Left lateral flexion 100%  Right rotation 100%  Left rotation 100%   (Blank rows = not tested)   LOWER EXTREMITY ROM:        Active  Right 10/16/2021 Left 10/16/2021  Hip flexion 120 120  Hip extension 30 30  Hip abduction 45 45  Hip adduction 30 30  Hip internal rotation 45 45  Hip external rotation 45 45  Knee flexion 135 135  Knee extension 0 0  Ankle dorsiflexion 20 20  Ankle plantarflexion 50 50  Ankle inversion      Ankle eversion       (Blank rows = not tested)        LOWER EXTREMITY MMT:     MMT Right eval Left eval  Hip flexion 5 5  Hip extension 4 4  Hip abduction 5 5  Hip adduction 4 4  Hip internal rotation 5 5  Hip external rotation  4  4  Knee flexion 5 5  Knee extension 5 5  Ankle dorsiflexion 5 5  Ankle plantarflexion      Ankle inversion      Ankle eversion       (Blank rows = not tested)   LUMBAR SPECIAL TESTS:  Straight leg raise test: Negative, FABER test: Negative, and FADIR Negative    FUNCTIONAL TESTS:  TA Activation with BP cuff pumped to 40 mmHg    - Cuff placed under lumbar spine     Trial 1: 70 mmHg                           Trial 2: 60 mmHg                            Trial 3: 60 mmHg                        TA Activation with leg loading/marches with  BP cuff pumped to 40 mmHg     - Cuff placed under lumbar spine     Trial 1: 55 mmHg                           Trial 2: 55 mmHg  Trial 3: 55 mmHg                             GAIT: Distance walked: 25 ft  Assistive device utilized: None Level of assistance: Complete Independence Comments: No deficits detected        TODAY'S TREATMENT   10/24/21              Nu-Step Seat and Arms Level 11 with resistance 3 for 5 min              Abdominal Crunches 3 x 10                -min VC to clear shoulder blades from mat table              Supine Bridges 1 x 10              Supine Bridges with Adductor Squeeze 2 x 10                          TA Activation with BP cuff pumped to 40 mmHg    - Cuff placed under lumbar spine     Trial 1: 70 mmHg                           Trial 2: 60 mmHg                            Trial 3: 60 mmHg                        TA Activation with leg loading/marches with  BP cuff pumped to 40 mmHg     - Cuff placed under lumbar spine     Trial 1: 55 mmHg                           Trial 2: 55 mmHg                            Trial 3: 55 mmHg                                           Initial  Lower Trunk Rotation 3 x 10        PATIENT EDUCATION:  Education details: Explanation of plan of care. Form and technique for appropriate exercise.  Person educated: Patient Education method: Explanation, Demonstration, Verbal cues, and Handouts Education comprehension: verbalized understanding, returned demonstration, and verbal cues required     HOME EXERCISE PROGRAM: Access Code: [email or web address] URL: [email or web address] Date: 10/24/2021 Prepared by: Bradly Chris  Exercises - Supine Lower Trunk Rotation  - 1 x daily - 7 x weekly - 3 sets - 10 reps - Prone Quadriceps Stretch with Strap  - 1 x daily - 7 x weekly - 1 sets - 3 reps - 30 hold - Ab Prep  - 1 x daily - 3 x weekly - 3 sets - 10 reps - Supine Bridge with Mini Swiss Ball Between Knees  - 1 x daily - 3 x weekly - 3 sets - 10 reps - Sidelying Hip  Adduction  - 1 x daily - 3 x weekly - 3 sets - 10 reps   ASSESSMENT:   CLINICAL IMPRESSION: Pt able to tolerate all exercises with no increase in her spine pain. She exhibits abdominal weakness as evidenced by her inability to maintain BP cuff pressure of 40 mmHg during biofeedback testing. 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    OBJECTIVE IMPAIRMENTS decreased strength, impaired flexibility, and pain.    ACTIVITY LIMITATIONS carrying, lifting, bending, sitting, and standing   PARTICIPATION LIMITATIONS: driving and occupation   PERSONAL FACTORS Time since onset of injury/illness/exacerbation and 1-2 comorbidities: DDD and hypertension  are also affecting patient's functional outcome.    REHAB POTENTIAL: Good   CLINICAL DECISION MAKING: Stable/uncomplicated   EVALUATION COMPLEXITY: Low     GOALS: Goals reviewed with patient? No   SHORT TERM GOALS: Target date: 10/31/2021   Pt will be independent with HEP in order to improve strength and balance in order to decrease fall risk and improve function at home and work. Baseline: NT Goal status: INITIAL     LONG TERM GOALS: Target date: 12/12/2021   Patient will have improved function and activity level as evidenced by an increase in FOTO score by 10 points or more.  Baseline: 82/82 Goal status: Deferred    2.  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  Baseline: Hip R/L Add 4/4, Ext 4/4 Goal status: ONGOING    3.  Patient will demonstrate improved core strength as evidenced by maintaining 40 mmHg pressure during stabilizer biofeedback measurement of TA in supine and with leg loading.  Baseline: 65 mmHg for TA Activation; 55 mmHg for leg loading   Goal status: ONGOING        PLAN: PT FREQUENCY: 1-2x/week   PT DURATION: 8 weeks   PLANNED INTERVENTIONS: Therapeutic exercises, Therapeutic activity, Neuromuscular re-education, Balance training, Gait training, Patient/Family education, Joint manipulation, Joint mobilization, Dry Needling, Electrical stimulation, Spinal manipulation, Spinal mobilization, Cryotherapy, Moist heat,  Biofeedback, Manual therapy, and Re-evaluation.   PLAN FOR NEXT SESSION:  Progress hip and lumbar strengthening exercises. Assess joint play of lumbar and thoracic spine.      Bradly Chris PT, DPT  10/24/2021, 3:12 PM

## 2021-10-25 ENCOUNTER — Other Ambulatory Visit: Payer: Self-pay | Admitting: Family Medicine

## 2021-10-31 ENCOUNTER — Telehealth: Payer: Self-pay | Admitting: Physical Therapy

## 2021-10-31 ENCOUNTER — Ambulatory Visit: Payer: Managed Care, Other (non HMO) | Admitting: Physical Therapy

## 2021-10-31 NOTE — Telephone Encounter (Signed)
Called pt to inquiry about absence. Pt reports thinking her apt was Thursday and not today. PT reminded pt of next visit on July12th and pt confirmed understanding of when next apt is.

## 2021-11-13 ENCOUNTER — Encounter: Payer: Managed Care, Other (non HMO) | Admitting: Physical Therapy

## 2021-11-14 ENCOUNTER — Encounter: Payer: Self-pay | Admitting: Physical Therapy

## 2021-11-14 ENCOUNTER — Ambulatory Visit: Payer: Managed Care, Other (non HMO) | Attending: Physician Assistant | Admitting: Physical Therapy

## 2021-11-14 DIAGNOSIS — M5459 Other low back pain: Secondary | ICD-10-CM | POA: Diagnosis not present

## 2021-11-14 NOTE — Therapy (Addendum)
OUTPATIENT PHYSICAL THERAPY TREATMENT NOTE  Discharge Summary: Patient has elected to discharge from PT because of an improvement in her low back pain.   Patient Name: Rose Marsh MRN: [id number] DOB:03-Aug-1965, 56 y.o., female Today's Date: 11/14/2021  PCP: Tally Joe FNP  REFERRING PROVIDER: Mardene Speak PA-C   END OF SESSION:   PT End of Session - 11/14/21 1504     Visit Number 3    Number of Visits 16    Date for PT Re-Evaluation 12/12/21    Authorization Type [employer]    Authorization Time Period PT/OT 30 per year    Authorization - Visit Number 3    Authorization - Number of Visits 30    Progress Note Due on Visit 10    PT Start Time 1502    PT Stop Time 1545    PT Time Calculation (min) 43 min    Activity Tolerance Patient tolerated treatment well    Behavior During Therapy WFL for tasks assessed/performed             Past Medical History:  Diagnosis Date   BV (bacterial vaginosis)    Hypertension    Ovarian cyst    Vasomotor symptoms due to menopause    Past Surgical History:  Procedure Laterality Date   LAPAROSCOPIC OVARIAN CYSTECTOMY Right    Patient Active Problem List   Diagnosis Date Noted   Chronic right-sided low back pain with right-sided sciatica 09/28/2021   Pelvic pain 09/14/2021   Family history of breast cancer 06/26/2021   Annual physical exam 06/01/2021   Elevated cholesterol 06/01/2021   Elevated red blood cell count 06/01/2021   Primary hypertension 06/01/2021   Breast pain, left 01/19/2021   DJD (degenerative joint disease), lumbosacral 01/19/2021   Screening mammogram, encounter for 01/19/2021   Low back pain 01/19/2021   Climacteric 11/12/2017   Hot flushes, perimenopausal 11/12/2017   Vaginal atrophy 11/12/2017   Dyspareunia in female 11/12/2017    REFERRING DIAG:   M54.40,G89.29 (ICD-10-CM) - Chronic low back pain with sciatica, sciatica laterality unspecified, unspecified back pain laterality  THERAPY  DIAG:  Other low back pain  Rationale for Evaluation and Treatment Rehabilitation  PERTINENT HISTORY: Per Janna Ostwalt's note on 09/14/21     Low back pain - Primary                               Chronic and stable today Continue to use heat, topical antiinflammatory A referral to PT was placed today.    PRECAUTIONS: None   SUBJECTIVE: Pt reports that she is feeling better since doing the exercises and the only time she feels pain is when rain storms happen.   PAIN:  Are you having pain? Yes: NPRS scale: 3/10 Pain location: Right sided low back pain and thoracic spine  Pain description: Achy with burning pain in the right hip down the leg  Aggravating factors: sitting for long periods of time  Relieving factors: standing up or laying down    OBJECTIVE: (objective measures completed at initial evaluation unless otherwise dated)  OBJECTIVE:              VITALS:  BP 165/95 HR 66 SpO2 100    DIAGNOSTIC FINDINGS:  CLINICAL DATA:  Back pain for 6 months   EXAM: LUMBAR SPINE - COMPLETE 4+ VIEW   COMPARISON:  11/04/2019   FINDINGS: Frontal, bilateral oblique, lateral views of the lumbar spine are  obtained. There are 5 non-rib-bearing lumbar type vertebral bodies identified in stable alignment. No acute fractures. There is stable moderate spondylosis at L5-S1. Marked facet hypertrophic changes from L3-4 through L5-S1 are also stable. Remaining disc spaces are well preserved. Sacroiliac joints are unremarkable.   IMPRESSION: 1. Stable lower lumbar spondylosis and facet hypertrophy, greatest at L5-S1.     PATIENT SURVEYS:  FOTO 83/100 (with 83% predicted)   SCREENING FOR RED FLAGS: Bowel or bladder incontinence: No Spinal tumors: No Cauda equina syndrome: No Compression fracture: No Abdominal aneurysm: No   COGNITION:           Overall cognitive status: Within functional limits for tasks assessed                          SENSATION: WFL   MUSCLE  LENGTH: Hamstrings: Right 90 deg; Left 90 deg Thomas test: Negative bilateral    POSTURE: weight shift left   PALPATION: Pain on on right spinous process with hip extension and hip internal rotation   Left hip flexor    LUMBAR ROM:    Active  A/PROM  eval  Flexion 100%  Extension 100%*  Right lateral flexion 100%  Left lateral flexion 100%  Right rotation 100%  Left rotation 100%   (Blank rows = not tested)   LOWER EXTREMITY ROM:        Active  Right 10/16/2021 Left 10/16/2021  Hip flexion 120 120  Hip extension 30 30  Hip abduction 45 45  Hip adduction 30 30  Hip internal rotation 45 45  Hip external rotation 45 45  Knee flexion 135 135  Knee extension 0 0  Ankle dorsiflexion 20 20  Ankle plantarflexion 50 50  Ankle inversion      Ankle eversion       (Blank rows = not tested)        LOWER EXTREMITY MMT:     MMT Right eval Left eval  Hip flexion 5 5  Hip extension 4 4  Hip abduction 5 5  Hip adduction 4 4  Hip internal rotation 5 5  Hip external rotation  4  4  Knee flexion 5 5  Knee extension 5 5  Ankle dorsiflexion 5 5  Ankle plantarflexion      Ankle inversion      Ankle eversion       (Blank rows = not tested)   LUMBAR SPECIAL TESTS:  Straight leg raise test: Negative, FABER test: Negative, and FADIR Negative    FUNCTIONAL TESTS:  TA Activation with BP cuff pumped to 40 mmHg    - Cuff placed under lumbar spine     Trial 1: 70 mmHg                           Trial 2: 60 mmHg                            Trial 3: 60 mmHg                        TA Activation with leg loading/marches with  BP cuff pumped to 40 mmHg     - Cuff placed under lumbar spine     Trial 1: 55 mmHg  Trial 2: 55 mmHg                            Trial 3: 55 mmHg                             GAIT: Distance walked: 25 ft  Assistive device utilized: None Level of assistance: Complete Independence Comments: No deficits detected        TODAY'S  TREATMENT   11/14/21             Nu-Step Seat and Arms Level 8 with resistance at 3             Abdominal Bicycles 3 x 10             -min VC for coordination to exercise              Quadruped Hip Extension 2 x 10              Side Step Down from 4 inch platform 3 x 10                           10/24/21              Nu-Step Seat and Arms Level 11 with resistance 3 for 5 min              Abdominal Crunches 3 x 10               -min VC to clear shoulder blades from mat table              Supine Bridges 1 x 10              Supine Bridges with Adductor Squeeze 2 x 10                          TA Activation with BP cuff pumped to 40 mmHg    - Cuff placed under lumbar spine     Trial 1: 70 mmHg                           Trial 2: 60 mmHg                            Trial 3: 60 mmHg                        TA Activation with leg loading/marches with  BP cuff pumped to 40 mmHg     - Cuff placed under lumbar spine     Trial 1: 55 mmHg                           Trial 2: 55 mmHg                            Trial 3: 55 mmHg                                           Initial  Lower Trunk Rotation  3 x 10        PATIENT EDUCATION:  Education details: Explanation of plan of care. Form and technique for appropriate exercise.  Person educated: Patient Education method: Explanation, Demonstration, Verbal cues, and Handouts Education comprehension: verbalized understanding, returned demonstration, and verbal cues required     HOME EXERCISE PROGRAM: Access Code: [email or web address] URL: [email or web address] Date: 11/14/2021 Prepared by: Bradly Chris  Exercises - Supine Lower Trunk Rotation  - 1 x daily - 7 x weekly - 3 sets - 10 reps - Prone Quadriceps Stretch with Strap  - 1 x daily - 7 x weekly - 1 sets - 3 reps - 30 hold - Supine Core Bicycle  - 1 x daily - 3 x weekly - 3 sets - 10 reps - Beginner Front Arm Support  - 1 x daily - 3 x weekly - 2 sets - 10 reps - [place] Hip  Adduction with Chair  - 1 x daily - 3 x weekly - 2 sets - 10 reps - Side Step Down with Counter Support  - 1 x daily - 3 x weekly - 3 sets - 10 reps ASSESSMENT:   CLINICAL IMPRESSION: Pt demonstrates an improvement in LE and abdominal strength along with reduced pain during session with ability to complete all progressed exercises. With continued improvement, she will be close to end of POC and can be reassessed next session. 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   OBJECTIVE IMPAIRMENTS decreased strength, impaired flexibility, and pain.    ACTIVITY LIMITATIONS carrying, lifting, bending, sitting, and standing   PARTICIPATION LIMITATIONS: driving and occupation   PERSONAL FACTORS Time since onset of injury/illness/exacerbation and 1-2 comorbidities: DDD and hypertension  are also affecting patient's functional outcome.    REHAB POTENTIAL: Good   CLINICAL DECISION MAKING: Stable/uncomplicated   EVALUATION COMPLEXITY: Low     GOALS: Goals reviewed with patient? No   SHORT TERM GOALS: Target date: 10/31/2021   Pt will be independent with HEP in order to improve strength and balance in order to decrease fall risk and improve function at home and work. Baseline: NT Goal status: INITIAL     LONG TERM GOALS: Target date: 12/12/2021   Patient will have improved function and activity level as evidenced by an increase in FOTO score by 10 points or more.  Baseline: 82/82 Goal status: Deferred    2.  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  Baseline: Hip R/L Add 4/4, Ext 4/4 Goal status: ONGOING    3.  Patient will demonstrate improved core  strength as evidenced by maintaining 40 mmHg pressure during stabilizer biofeedback measurement of TA in supine and with leg loading.  Baseline: 65 mmHg for TA Activation; 55 mmHg for leg loading   Goal status: ONGOING        PLAN: PT FREQUENCY: 1-2x/week   PT DURATION: 8 weeks   PLANNED INTERVENTIONS: Therapeutic exercises, Therapeutic activity, Neuromuscular re-education, Balance training, Gait training, Patient/Family education, Joint manipulation, Joint mobilization, Dry Needling, Electrical stimulation, Spinal manipulation, Spinal mobilization, Cryotherapy, Moist heat, Biofeedback, Manual therapy, and Re-evaluation.   PLAN FOR NEXT SESSION:  FOTO and reassess goals. Progress hip and  lumbar strengthening exercises.      Bradly Chris PT, DPT  11/14/2021, 3:05 PM

## 2021-11-20 ENCOUNTER — Other Ambulatory Visit: Payer: Self-pay | Admitting: Family Medicine

## 2021-11-20 ENCOUNTER — Encounter: Payer: Managed Care, Other (non HMO) | Admitting: Physical Therapy

## 2021-11-21 ENCOUNTER — Ambulatory Visit: Payer: Managed Care, Other (non HMO) | Admitting: Physician Assistant

## 2021-11-21 ENCOUNTER — Ambulatory Visit: Payer: Managed Care, Other (non HMO) | Admitting: Physical Therapy

## 2021-11-26 ENCOUNTER — Encounter: Payer: Managed Care, Other (non HMO) | Admitting: Physical Therapy

## 2021-11-28 ENCOUNTER — Encounter: Payer: Managed Care, Other (non HMO) | Admitting: Physical Therapy

## 2021-12-05 ENCOUNTER — Ambulatory Visit: Payer: Managed Care, Other (non HMO) | Admitting: Family Medicine

## 2021-12-05 ENCOUNTER — Encounter: Payer: Self-pay | Admitting: Family Medicine

## 2021-12-05 ENCOUNTER — Telehealth: Payer: Self-pay

## 2021-12-05 ENCOUNTER — Ambulatory Visit: Payer: Managed Care, Other (non HMO) | Admitting: Physical Therapy

## 2021-12-05 VITALS — BP 165/95 | HR 68 | Temp 98.1°F | Ht 67.01 in | Wt 155.7 lb

## 2021-12-05 DIAGNOSIS — N951 Menopausal and female climacteric states: Secondary | ICD-10-CM

## 2021-12-05 DIAGNOSIS — I10 Essential (primary) hypertension: Secondary | ICD-10-CM

## 2021-12-05 MED ORDER — LOSARTAN POTASSIUM-HCTZ 100-25 MG PO TABS: 1.0000 | ORAL_TABLET | Freq: Every day | ORAL | 1 refills | Status: DC

## 2021-12-05 MED ORDER — PAROXETINE HCL 10 MG PO TABS: 10.0000 mg | ORAL_TABLET | Freq: Every day | ORAL | 3 refills | Status: DC

## 2021-12-05 MED ORDER — HYDROXYZINE HCL 10 MG PO TABS
10.0000 mg | ORAL_TABLET | Freq: Every evening | ORAL | 3 refills | Status: DC | PRN
Start: 2021-12-05 — End: 2022-12-11

## 2021-12-05 NOTE — Assessment & Plan Note (Signed)
Chronic, elevated Previous use of both norvasc 5-10 mg, and clonidine, however, primarily used for vasomotor symptoms clonidine not at goal for HTN treatment- was using 0.1 mg daily; recommendation of BID dosing with titration to meet HTN guidelines Add Hyzaar to assist; stop clonidine  Repeat labs Goal of <140/<90

## 2021-12-05 NOTE — Assessment & Plan Note (Signed)
Chronic, stable Continue atarax 10 mg qHS PRN to assist

## 2021-12-05 NOTE — Progress Notes (Signed)
Established patient visit   Patient: Rose Marsh   DOB: 1966/04/06   56 y.o. Female  MRN: [id number] Visit Date: 12/05/2021  Today's healthcare provider: [name], FNP  Introduced to nurse practitioner role and practice setting.  All questions answered.  Discussed provider/patient relationship and expectations.  Subjective    Patient presents reports high blood pressure reading averaging 160/110.  Medications: Outpatient Medications Prior to Visit  Medication Sig   [DISCONTINUED] cloNIDine (CATAPRES) 0.1 MG tablet Take 1 tablet (0.1 mg total) by mouth daily.   [DISCONTINUED] hydrOXYzine (ATARAX) 10 MG tablet TAKE 1 TABLET BY MOUTH AS DIRECTED AS NEEDED FOR INSOMNIA   [DISCONTINUED] PARoxetine (PAXIL) 10 MG tablet Take 0.5 tablets (5 mg total) by mouth daily.   [DISCONTINUED] terconazole (TERAZOL 7) 0.4 % vaginal cream Place 1 applicator vaginally at bedtime.   [DISCONTINUED] cyclobenzaprine (FLEXERIL) 5 MG tablet Take 1 tablet (5 mg total) by mouth at bedtime. (Patient not taking: Reported on 12/05/2021)   [DISCONTINUED] meloxicam (MOBIC) 15 MG tablet TAKE 1 TABLET (15 MG TOTAL) BY MOUTH DAILY. (Patient not taking: Reported on 12/05/2021)   No facility-administered medications prior to visit.    Review of Systems  Last CBC Lab Results  Component Value Date   WBC 4.1 06/01/2021   HGB 14.9 06/01/2021   HCT 44.8 06/01/2021   MCV 80 06/01/2021   MCH 26.8 06/01/2021   RDW 14.1 06/01/2021   PLT 250 66/44/0347   Last metabolic panel Lab Results  Component Value Date   GLUCOSE 82 06/01/2021   NA 142 06/01/2021   K 4.3 06/01/2021   CL 102 06/01/2021   CO2 26 06/01/2021   BUN 12 06/01/2021   CREATININE 0.78 06/01/2021   EGFR 89 06/01/2021   CALCIUM 10.1 06/01/2021   PROT 7.2 06/01/2021   ALBUMIN 5.1 (H) 06/01/2021   LABGLOB 2.1 06/01/2021   AGRATIO 2.4 (H) 06/01/2021   BILITOT 0.4 06/01/2021   ALKPHOS 79 06/01/2021   AST 66 (H) 06/01/2021   ALT  46 (H) 06/01/2021   Last lipids Lab Results  Component Value Date   CHOL 214 (H) 06/01/2021   HDL 89 06/01/2021   LDLCALC 114 (H) 06/01/2021   TRIG 63 06/01/2021   CHOLHDL 2.4 06/01/2021   Last thyroid functions Lab Results  Component Value Date   TSH 1.210 05/03/2020       Objective    BP (!) 165/95   Pulse 68   Temp 98.1 F (36.7 C) (Oral)   Ht 5' 7.01" (1.702 m)   Wt 155 lb 11.2 oz (70.6 kg)   SpO2 100%   BMI 24.38 kg/m   BP Readings from Last 3 Encounters:  12/05/21 (!) 165/95  09/28/21 (!) 155/99  09/14/21 (!) 148/100   Wt Readings from Last 3 Encounters:  12/05/21 155 lb 11.2 oz (70.6 kg)  09/28/21 155 lb (70.3 kg)  09/14/21 154 lb (69.9 kg)   SpO2 Readings from Last 3 Encounters:  12/05/21 100%  09/28/21 98%  09/14/21 100%      Physical Exam Vitals and nursing note reviewed.  Constitutional:      General: She is not in acute distress.    Appearance: Normal appearance. She is normal weight. She is not ill-appearing, toxic-appearing or diaphoretic.  HENT:     Head: Normocephalic and atraumatic.  Neck:     Vascular: No carotid bruit.  Cardiovascular:     Rate and Rhythm: Normal rate and regular rhythm.  Pulses: Normal pulses.     Heart sounds: Normal heart sounds. No murmur heard.    No friction rub. No gallop.  Pulmonary:     Effort: Pulmonary effort is normal. No respiratory distress.     Breath sounds: Normal breath sounds. No stridor. No wheezing, rhonchi or rales.  Chest:     Chest wall: No tenderness.  Abdominal:     General: Bowel sounds are normal.     Palpations: Abdomen is soft.  Musculoskeletal:        General: No swelling, tenderness, deformity or signs of injury. Normal range of motion.     Cervical back: Normal range of motion and neck supple.     Right lower leg: No edema.     Left lower leg: No edema.  Skin:    General: Skin is warm and dry.     Capillary Refill: Capillary refill takes less than 2 seconds.      Coloration: Skin is not jaundiced or pale.     Findings: No bruising, erythema, lesion or rash.  Neurological:     General: No focal deficit present.     Mental Status: She is alert and oriented to person, place, and time. Mental status is at baseline.     Cranial Nerves: No cranial nerve deficit.     Sensory: No sensory deficit.     Motor: No weakness.     Coordination: Coordination normal.  Psychiatric:        Mood and Affect: Mood normal.        Behavior: Behavior normal.        Thought Content: Thought content normal.        Judgment: Judgment normal.     No results found for any visits on 12/05/21.  Assessment & Plan     Problem List Items Addressed This Visit       Cardiovascular and Mediastinum   Hot flushes, perimenopausal    Chronic, waxes/wanes Back on 10 mg of paxil to assist; previously on 5 mg      Relevant Medications   losartan-hydrochlorothiazide (HYZAAR) 100-25 MG tablet   PARoxetine (PAXIL) 10 MG tablet   Other Relevant Orders   Vitamin D (25 hydroxy)   Hemoglobin A1c   Primary hypertension - Primary    Chronic, elevated Previous use of both norvasc 5-10 mg, and clonidine, however, primarily used for vasomotor symptoms clonidine not at goal for HTN treatment- was using 0.1 mg daily; recommendation of BID dosing with titration to meet HTN guidelines Add Hyzaar to assist; stop clonidine  Repeat labs Goal of <140/<90      Relevant Medications   losartan-hydrochlorothiazide (HYZAAR) 100-25 MG tablet   Other Relevant Orders   Comprehensive Metabolic Panel (CMET)   CBC with Differential/Platelet   TSH + free T4   Lipid panel   Hemoglobin A1c     Other   Insomnia associated with menopause    Chronic, stable Continue atarax 10 mg qHS PRN to assist       Relevant Medications   hydrOXYzine (ATARAX) 10 MG tablet   Other Relevant Orders   TSH + free T4   Vitamin D (25 hydroxy)     Return in about 6 weeks (around 01/16/2022) for HTN management.       Vonna Kotyk, FNP, have reviewed all documentation for this visit. The documentation on 12/05/21 for the exam, diagnosis, procedures, and orders are all accurate and complete.    [name], FNP  [place] [phone] (phone) [phone] (fax)  [place]

## 2021-12-05 NOTE — Patient Instructions (Signed)
The CDC recommends two doses of Shingrix (the new shingles vaccine) separated by 2 to 6 months for adults age 58 years and older. I recommend checking with your insurance plan regarding coverage for this vaccine.    

## 2021-12-05 NOTE — Telephone Encounter (Signed)
noted 

## 2021-12-05 NOTE — Telephone Encounter (Signed)
Copied from [place] [id number]. Topic: General - Inquiry >> Dec 05, 2021  1:16 PM Marcellus Scott wrote: Reason for CRM: Pt stated she is running behind for the appt. Pt made aware of late policy

## 2021-12-05 NOTE — Assessment & Plan Note (Addendum)
Chronic, waxes/wanes Back on 10 mg of paxil to assist; previously on 5 mg

## 2021-12-06 LAB — HEMOGLOBIN A1C
Est. average glucose Bld gHb Est-mCnc: 111 mg/dL
Hgb A1c MFr Bld: 5.5 % (ref 4.8–5.6)

## 2021-12-06 LAB — TSH+FREE T4
Free T4: 1 ng/dL (ref 0.82–1.77)
TSH: 1.08 u[IU]/mL (ref 0.450–4.500)

## 2021-12-06 LAB — COMPREHENSIVE METABOLIC PANEL
ALT: 24 IU/L (ref 0–32)
AST: 28 IU/L (ref 0–40)
Albumin/Globulin Ratio: 2.1 (ref 1.2–2.2)
Albumin: 4.8 g/dL (ref 3.8–4.9)
Alkaline Phosphatase: 86 IU/L (ref 44–121)
BUN/Creatinine Ratio: 17 (ref 9–23)
BUN: 13 mg/dL (ref 6–24)
Bilirubin Total: 0.3 mg/dL (ref 0.0–1.2)
CO2: 22 mmol/L (ref 20–29)
Calcium: 10.2 mg/dL (ref 8.7–10.2)
Chloride: 99 mmol/L (ref 96–106)
Creatinine, Ser: 0.77 mg/dL (ref 0.57–1.00)
Globulin, Total: 2.3 g/dL (ref 1.5–4.5)
Glucose: 98 mg/dL (ref 70–99)
Potassium: 4.6 mmol/L (ref 3.5–5.2)
Sodium: 141 mmol/L (ref 134–144)
Total Protein: 7.1 g/dL (ref 6.0–8.5)
eGFR: 90 mL/min/{1.73_m2} (ref >59)

## 2021-12-06 LAB — CBC WITH DIFFERENTIAL/PLATELET
Basophils Absolute: 0.1 x10E3/uL (ref 0.0–0.2)
Basos: 1 %
EOS (ABSOLUTE): 0.1 x10E3/uL (ref 0.0–0.4)
Eos: 1 %
Hematocrit: 42.7 % (ref 34.0–46.6)
Hemoglobin: 14.6 g/dL (ref 11.1–15.9)
Immature Grans (Abs): 0 x10E3/uL (ref 0.0–0.1)
Immature Granulocytes: 0 %
Lymphocytes Absolute: 2.6 x10E3/uL (ref 0.7–3.1)
Lymphs: 41 %
MCH: 27.4 pg (ref 26.6–33.0)
MCHC: 34.2 g/dL (ref 31.5–35.7)
MCV: 80 fL (ref 79–97)
Monocytes Absolute: 0.4 x10E3/uL (ref 0.1–0.9)
Monocytes: 6 %
Neutrophils Absolute: 3.2 x10E3/uL (ref 1.4–7.0)
Neutrophils: 51 %
Platelets: 311 x10E3/uL (ref 150–450)
RBC: 5.33 x10E6/uL — ABNORMAL HIGH (ref 3.77–5.28)
RDW: 14.5 % (ref 11.7–15.4)
WBC: 6.4 x10E3/uL (ref 3.4–10.8)

## 2021-12-06 LAB — LIPID PANEL
Chol/HDL Ratio: 2.5 ratio (ref 0.0–4.4)
Cholesterol, Total: 219 mg/dL — ABNORMAL HIGH (ref 100–199)
HDL: 89 mg/dL
LDL Chol Calc (NIH): 118 mg/dL — ABNORMAL HIGH (ref 0–99)
Triglycerides: 66 mg/dL (ref 0–149)
VLDL Cholesterol Cal: 12 mg/dL (ref 5–40)

## 2021-12-06 LAB — VITAMIN D 25 HYDROXY (VIT D DEFICIENCY, FRACTURES): Vit D, 25-Hydroxy: 33.4 ng/mL (ref 30.0–100.0)

## 2021-12-06 NOTE — Progress Notes (Signed)
Borderline Vit D; could add 2000 IU/day to assist. Likely were low prior to use of current supplement.   All other labs normal/stable.  The 10-year ASCVD risk score (Arnett DK, et al., 2019) is: 3.9%   Values used to calculate the score:     Age: 56 years     Sex: Female     Is Non-Hispanic African American: No     Diabetic: No     Tobacco smoker: No     Systolic Blood Pressure: 094 mmHg     Is BP treated: Yes     HDL Cholesterol: 89 mg/dL     Total Cholesterol: 219 mg/dL Heart attack and stroke risk is 4% estimated within the next 10 years which is low.  Could add low dose Crestor at 5 mg to assist if desired; however, recommend treatment of BP to determine new risk score in 1 month.  Please let us know if you have any questions.  Thank you, [name], [address] [place], [place] [address] [phone] (phone) [phone] (fax) [place]

## 2021-12-07 ENCOUNTER — Telehealth: Payer: Self-pay

## 2021-12-07 MED ORDER — ROSUVASTATIN CALCIUM 5 MG PO TABS: 5.0000 mg | ORAL_TABLET | Freq: Every day | ORAL | 1 refills | Status: DC

## 2021-12-07 NOTE — Telephone Encounter (Signed)
-----   Message from [name], FNP sent at 12/06/2021 11:39 AM EDT ----- Borderline Vit D; could add 2000 IU/day to assist. Likely were low prior to use of current supplement.   All other labs normal/stable.  The 10-year ASCVD risk score (Arnett DK, et al., 2019) is: 3.9%   Values used to calculate the score:     Age: 56 years     Sex: Female     Is Non-Hispanic African American: No     Diabetic: No     Tobacco smoker: No     Systolic Blood Pressure: 267 mmHg     Is BP treated: Yes     HDL Cholesterol: 89 mg/dL     Total Cholesterol: 219 mg/dL Heart attack and stroke risk is 4% estimated within the next 10 years which is low.  Could add low dose Crestor at 5 mg to assist if desired; however, recommend treatment of BP to determine new risk score in 1 month.  Please let us know if you have any questions.  Thank you, [name], [address] [place], [place] [address] [phone] (phone) [phone] (fax) [place]

## 2021-12-12 ENCOUNTER — Encounter: Payer: Managed Care, Other (non HMO) | Admitting: Physical Therapy

## 2021-12-19 ENCOUNTER — Encounter: Payer: Managed Care, Other (non HMO) | Admitting: Physical Therapy

## 2021-12-26 ENCOUNTER — Encounter: Payer: Managed Care, Other (non HMO) | Admitting: Physical Therapy

## 2022-01-02 ENCOUNTER — Encounter: Payer: Managed Care, Other (non HMO) | Admitting: Physical Therapy

## 2022-01-03 ENCOUNTER — Encounter: Payer: Self-pay | Admitting: Family Medicine

## 2022-01-11 ENCOUNTER — Encounter: Payer: Self-pay | Admitting: Family Medicine

## 2022-01-11 ENCOUNTER — Ambulatory Visit (INDEPENDENT_AMBULATORY_CARE_PROVIDER_SITE_OTHER): Payer: Managed Care, Other (non HMO) | Admitting: Family Medicine

## 2022-01-11 VITALS — BP 132/84 | HR 80

## 2022-01-11 DIAGNOSIS — I10 Essential (primary) hypertension: Secondary | ICD-10-CM | POA: Diagnosis not present

## 2022-01-11 NOTE — Progress Notes (Addendum)
Patient is here for HTN check. Has transitioned to Hyzaar 100-25. Denies complications with medication.  Seen by CMA team alone, not by NP.  [name], [address] [place], [place] [address] [phone] (phone) [phone] (fax) [place]

## 2022-01-27 ENCOUNTER — Other Ambulatory Visit: Payer: Self-pay | Admitting: Family Medicine

## 2022-01-27 DIAGNOSIS — N951 Menopausal and female climacteric states: Secondary | ICD-10-CM

## 2022-01-30 ENCOUNTER — Other Ambulatory Visit: Payer: Self-pay | Admitting: Family Medicine

## 2022-01-30 DIAGNOSIS — N951 Menopausal and female climacteric states: Secondary | ICD-10-CM

## 2022-06-03 ENCOUNTER — Other Ambulatory Visit: Payer: Self-pay | Admitting: Family Medicine

## 2022-06-03 DIAGNOSIS — I10 Essential (primary) hypertension: Secondary | ICD-10-CM

## 2022-06-07 ENCOUNTER — Encounter: Payer: Managed Care, Other (non HMO) | Admitting: Family Medicine

## 2022-06-14 ENCOUNTER — Ambulatory Visit (INDEPENDENT_AMBULATORY_CARE_PROVIDER_SITE_OTHER): Payer: Managed Care, Other (non HMO) | Admitting: Family Medicine

## 2022-06-14 ENCOUNTER — Encounter: Payer: Self-pay | Admitting: Family Medicine

## 2022-06-14 VITALS — BP 118/73 | HR 86 | Temp 98.2°F | Ht 67.0 in | Wt 156.0 lb

## 2022-06-14 DIAGNOSIS — Z0000 Encounter for general adult medical examination without abnormal findings: Secondary | ICD-10-CM

## 2022-06-14 DIAGNOSIS — E7800 Pure hypercholesterolemia, unspecified: Secondary | ICD-10-CM | POA: Diagnosis not present

## 2022-06-14 DIAGNOSIS — N951 Menopausal and female climacteric states: Secondary | ICD-10-CM | POA: Diagnosis not present

## 2022-06-14 DIAGNOSIS — Z1231 Encounter for screening mammogram for malignant neoplasm of breast: Secondary | ICD-10-CM

## 2022-06-14 DIAGNOSIS — I10 Essential (primary) hypertension: Secondary | ICD-10-CM | POA: Diagnosis not present

## 2022-06-14 DIAGNOSIS — D171 Benign lipomatous neoplasm of skin and subcutaneous tissue of trunk: Secondary | ICD-10-CM | POA: Diagnosis not present

## 2022-06-14 MED ORDER — LOSARTAN POTASSIUM-HCTZ 100-25 MG PO TABS: 1.0000 | ORAL_TABLET | Freq: Every day | ORAL | 3 refills | Status: DC

## 2022-06-14 NOTE — Progress Notes (Signed)
Complete physical exam  Patient: Rose Marsh   DOB: 1966/04/14   57 y.o. Female  MRN: [id number] Visit Date: 06/14/2022  Today's healthcare provider: [name], FNP  Introduced to nurse practitioner role and practice setting.  All questions answered.  Discussed provider/patient relationship and expectations.   Subjective    Rose Marsh is a 57 y.o. female who presents today for a complete physical exam.  She reports consuming a general diet. Gym/ health club routine includes low impact aerobics and walking on track . She generally feels well. She reports sleeping fairly well. She does not have additional problems to discuss today.   HPI HPI   physical Last edited by Elta Guadeloupe, CMA on 06/14/2022  3:00 PM.      Past Medical History:  Diagnosis Date   BV (bacterial vaginosis)    Hypertension    Ovarian cyst    Vasomotor symptoms due to menopause    Past Surgical History:  Procedure Laterality Date   LAPAROSCOPIC OVARIAN CYSTECTOMY Right    Social History   Socioeconomic History   Marital status: Married    Spouse name: Not on file   Number of children: Not on file   Years of education: Not on file   Highest education level: Not on file  Occupational History   Not on file  Tobacco Use   Smoking status: Never   Smokeless tobacco: Never  Vaping Use   Vaping Use: Never used  Substance and Sexual Activity   Alcohol use: Yes    Comment: infrequently   Drug use: Never   Sexual activity: Yes    Birth control/protection: None  Other Topics Concern   Not on file  Social History Narrative   Not on file   Social Determinants of Health   Financial Resource Strain: Not on file  Food Insecurity: Not on file  Transportation Needs: Not on file  Physical Activity: Not on file  Stress: Not on file  Social Connections: Not on file  Intimate Partner Violence: Not on file   Family Status  Relation Name Status   Mother  Alive   Father   Deceased   Sister  Alive   PGM  Deceased   Neg Hx  (Not Specified)   Family History  Problem Relation Age of Onset   Arthritis Mother    Rheum arthritis Mother    Brain cancer Father    Stroke Father    Breast cancer Sister        64s   Bone cancer Paternal Grandmother    Colon cancer Neg Hx    No Known Allergies  Patient Care Team: [name], FNP as PCP - General (Family Medicine)   Medications: Outpatient Medications Prior to Visit  Medication Sig   hydrOXYzine (ATARAX) 10 MG tablet Take 1 tablet (10 mg total) by mouth at bedtime as needed for anxiety.   PARoxetine (PAXIL) 10 MG tablet Take 1 tablet (10 mg total) by mouth daily.   rosuvastatin (CRESTOR) 5 MG tablet Take 1 tablet (5 mg total) by mouth daily.   [DISCONTINUED] losartan-hydrochlorothiazide (HYZAAR) 100-25 MG tablet TAKE 1 TABLET BY MOUTH EVERY DAY   No facility-administered medications prior to visit.    Review of Systems   Objective    BP 118/73 (BP Location: Right Arm, Patient Position: Sitting, Cuff Size: Normal)   Pulse 86   Temp 98.2 F (36.8 C)   Ht 5' 7"$  (1.702 m)   Wt  156 lb (70.8 kg)   SpO2 100%   BMI 24.43 kg/m   Physical Exam Vitals and nursing note reviewed.  Constitutional:      General: She is awake. She is not in acute distress.    Appearance: Normal appearance. She is well-developed, well-groomed and normal weight. She is not ill-appearing, toxic-appearing or diaphoretic.  HENT:     Head: Normocephalic and atraumatic.     Jaw: There is normal jaw occlusion. No trismus, tenderness, swelling or pain on movement.     Right Ear: Hearing, tympanic membrane, ear canal and external ear normal. There is no impacted cerumen.     Left Ear: Hearing, tympanic membrane, ear canal and external ear normal. There is no impacted cerumen.     Nose: Nose normal. No congestion or rhinorrhea.     Right Turbinates: Not enlarged, swollen or pale.     Left Turbinates: Not enlarged, swollen or pale.      Right Sinus: No maxillary sinus tenderness or frontal sinus tenderness.     Left Sinus: No maxillary sinus tenderness or frontal sinus tenderness.     Mouth/Throat:     Lips: Pink.     Mouth: Mucous membranes are moist. No injury.     Tongue: No lesions.     Pharynx: Oropharynx is clear. Uvula midline. No pharyngeal swelling, oropharyngeal exudate, posterior oropharyngeal erythema or uvula swelling.     Tonsils: No tonsillar exudate or tonsillar abscesses.  Eyes:     General: Lids are normal. Lids are everted, no foreign bodies appreciated. Vision grossly intact. Gaze aligned appropriately. No allergic shiner or visual field deficit.       Right eye: No discharge.        Left eye: No discharge.     Extraocular Movements: Extraocular movements intact.     Conjunctiva/sclera: Conjunctivae normal.     Right eye: Right conjunctiva is not injected. No exudate.    Left eye: Left conjunctiva is not injected. No exudate.    Pupils: Pupils are equal, round, and reactive to light.  Neck:     Thyroid: No thyroid mass, thyromegaly or thyroid tenderness.     Vascular: No carotid bruit.     Trachea: Trachea normal.  Cardiovascular:     Rate and Rhythm: Normal rate and regular rhythm.     Pulses: Normal pulses.          Carotid pulses are 2+ on the right side and 2+ on the left side.      Radial pulses are 2+ on the right side and 2+ on the left side.       Dorsalis pedis pulses are 2+ on the right side and 2+ on the left side.       Posterior tibial pulses are 2+ on the right side and 2+ on the left side.     Heart sounds: Normal heart sounds, S1 normal and S2 normal. No murmur heard.    No friction rub. No gallop.  Pulmonary:     Effort: Pulmonary effort is normal. No respiratory distress.     Breath sounds: Normal breath sounds and air entry. No stridor. No wheezing, rhonchi or rales.  Chest:     Chest wall: No tenderness.     Comments: Breasts: breasts appear normal, no suspicious  masses, no skin or nipple changes or axillary nodes, symmetric fibrous changes in both upper outer quadrants, right breast normal without mass, skin or nipple changes or axillary nodes, left breast normal  without mass, skin or nipple changes or axillary nodes, risk and benefit of breast self-exam was discussed  Abdominal:     General: Abdomen is flat. Bowel sounds are normal. There is no distension.     Palpations: Abdomen is soft. There is no mass.     Tenderness: There is no abdominal tenderness. There is no right CVA tenderness, left CVA tenderness, guarding or rebound.     Hernia: No hernia is present.  Genitourinary:    Comments: Exam deferred; denies complaints Musculoskeletal:        General: No swelling, tenderness, deformity or signs of injury. Normal range of motion.     Cervical back: Full passive range of motion without pain, normal range of motion and neck supple. No edema, rigidity or tenderness. No muscular tenderness.     Right lower leg: No edema.     Left lower leg: No edema.  Lymphadenopathy:     Cervical: No cervical adenopathy.     Right cervical: No superficial, deep or posterior cervical adenopathy.    Left cervical: No superficial, deep or posterior cervical adenopathy.  Skin:    General: Skin is warm and dry.     Capillary Refill: Capillary refill takes less than 2 seconds.     Coloration: Skin is not jaundiced or pale.     Findings: Lesion present. No bruising, erythema or rash.          Comments: Consistent with deep lipoma; change in size. Soft, occasionally tender. When inflamed may cause pain on R hip/R leg  Neurological:     General: No focal deficit present.     Mental Status: She is alert and oriented to person, place, and time. Mental status is at baseline.     GCS: GCS eye subscore is 4. GCS verbal subscore is 5. GCS motor subscore is 6.     Sensory: Sensation is intact. No sensory deficit.     Motor: Motor function is intact. No weakness.      Coordination: Coordination is intact. Coordination normal.     Gait: Gait is intact. Gait normal.  Psychiatric:        Attention and Perception: Attention and perception normal.        Mood and Affect: Mood and affect normal.        Speech: Speech normal.        Behavior: Behavior normal. Behavior is cooperative.        Thought Content: Thought content normal.        Cognition and Memory: Cognition and memory normal.        Judgment: Judgment normal.      Last depression screening scores    06/14/2022    3:02 PM 12/05/2021    1:46 PM 09/28/2021    2:26 PM  PHQ 2/9 Scores  PHQ - 2 Score 0 0 0  PHQ- 9 Score 0 0 0   Last fall risk screening    06/14/2022    3:02 PM  [place] in the past year? 0  Number falls in past yr: 0  Injury with Fall? 0   Last Audit-C alcohol use screening    06/14/2022    3:02 PM  Alcohol Use Disorder Test (AUDIT)  1. How often do you have a drink containing alcohol? 1  2. How many drinks containing alcohol do you have on a typical day when you are drinking? 0  3. How often do you have six or more  drinks on one occasion? 0  AUDIT-C Score 1   A score of 3 or more in women, and 4 or more in men indicates increased risk for alcohol abuse, EXCEPT if all of the points are from question 1   No results found for any visits on 06/14/22.  Assessment & Plan    Routine Health Maintenance and Physical Exam  Exercise Activities and Dietary recommendations  Goals   None     Immunization History  Administered Date(s) Administered   Influenza, Seasonal, Injecte, Preservative Fre 02/04/2016   Influenza,inj,Quad PF,6+ Mos 02/07/2015, 01/22/2019   Influenza-Unspecified 01/19/2020, 02/17/2021   Moderna Sars-Covid-2 Vaccination 05/05/2019, 06/04/2019, 04/18/2020   PPD Test 03/29/2013   Tdap 10/25/2011, 03/29/2013   Zoster, Unspecified 02/03/2021, 09/03/2021    Health Maintenance  Topic Date Due   Zoster Vaccines- Shingrix (1 of 2) 02/20/2014    COVID-19 Vaccine (4 - 2023-24 season) 01/04/2022   INFLUENZA VACCINE  08/04/2022 (Originally 12/04/2021)   DTaP/Tdap/Td (3 - Td or Tdap) 03/30/2023   MAMMOGRAM  08/04/2023   COLONOSCOPY (Pts 45-84yr Insurance coverage will need to be confirmed)  02/01/2024   PAP SMEAR-Modifier  05/03/2025   Hepatitis C Screening  Completed   HIV Screening  Completed   HPV VACCINES  Aged Out    Discussed health benefits of physical activity, and encouraged her to engage in regular exercise appropriate for her age and condition.  Problem List Items Addressed This Visit       Cardiovascular and Mediastinum   Hot flushes, perimenopausal    Chronic, stable Continue low dose paxil 10 to assist Can try titration off in 8/24 when rx will expire Defers hormone testing at this time       Relevant Medications   losartan-hydrochlorothiazide (HYZAAR) 100-25 MG tablet   Primary hypertension    Chronic, well controlled Continue hyzaar 100-25 Goal <130/<80      Relevant Medications   losartan-hydrochlorothiazide (HYZAAR) 100-25 MG tablet     Other   Annual physical exam - Primary    UTD on vision and dental Things to do to keep yourself healthy  - Exercise at least 30-45 minutes a day, 3-4 days a week.  - Eat a low-fat diet with lots of fruits and vegetables, up to 7-9 servings per day.  - Seatbelts can save your life. Wear them always.  - Smoke detectors on every level of your home, check batteries every year.  - Eye Doctor - have an eye exam every 1-2 years  - Safe sex - if you may be exposed to STDs, use a condom.  - Alcohol -  If you drink, do it moderately, less than 2 drinks per day.  - [place] Choose someone to speak for you if you are not able.  - Depression is common in our stressful world.If you're feeling down or losing interest in things you normally enjoy, please come in for a visit.  - Violence - If anyone is threatening or hurting you, please call immediately.        Elevated cholesterol    Chronic, well controlled with 5 mg crestor Defers repeat LP at this time recommend diet low in saturated fat and regular exercise - 30 min at least 5 times per week       Relevant Medications   losartan-hydrochlorothiazide (HYZAAR) 100-25 MG tablet   Lipoma of torso    Acute, variable Request for imaging to assist       Relevant Orders  [place] LT LOWER EXTREM LTD SOFT TISSUE NON VASCULAR   Screening mammogram, encounter for    Due for screening for mammogram, denies breast concerns, provided with phone number to call and schedule appointment for mammogram. Encouraged to repeat breast cancer screening every 1-2 years.       Relevant Orders   MM 3D SCREEN BREAST BILATERAL   Return in about 1 year (around 06/16/2023) for annual examination.    Vonna Kotyk, FNP, have reviewed all documentation for this visit. The documentation on 06/14/22 for the exam, diagnosis, procedures, and orders are all accurate and complete.  [name], [place] [phone] (phone) [phone] (fax)  [place]

## 2022-06-14 NOTE — Patient Instructions (Signed)
Please call and schedule your mammogram:  [facility]  [place], [place],  [place]  [address] Get Driving Directions Main: [phone]  Sunday:Closed Monday:7:20 AM - 5:00 PM Tuesday:7:20 AM - 5:00 PM Wednesday:7:20 AM - 5:00 PM Thursday:7:20 AM - 5:00 PM Friday:7:20 AM - 4:30 PM Saturday:Closed

## 2022-06-14 NOTE — Assessment & Plan Note (Signed)
Chronic, stable Continue low dose paxil 10 to assist Can try titration off in 8/24 when rx will expire Defers hormone testing at this time

## 2022-06-14 NOTE — Assessment & Plan Note (Signed)
Due for screening for mammogram, denies breast concerns, provided with phone number to call and schedule appointment for mammogram. Encouraged to repeat breast cancer screening every 1-2 years.  

## 2022-06-14 NOTE — Assessment & Plan Note (Signed)
Chronic, well controlled with 5 mg crestor Defers repeat LP at this time recommend diet low in saturated fat and regular exercise - 30 min at least 5 times per week

## 2022-06-14 NOTE — Assessment & Plan Note (Signed)
UTD on vision and dental Things to do to keep yourself healthy  - Exercise at least 30-45 minutes a day, 3-4 days a week.  - Eat a low-fat diet with lots of fruits and vegetables, up to 7-9 servings per day.  - Seatbelts can save your life. Wear them always.  - Smoke detectors on every level of your home, check batteries every year.  - Eye Doctor - have an eye exam every 1-2 years  - Safe sex - if you may be exposed to STDs, use a condom.  - Alcohol -  If you drink, do it moderately, less than 2 drinks per day.  - [place]. Choose someone to speak for you if you are not able.  - Depression is common in our stressful world.If you're feeling down or losing interest in things you normally enjoy, please come in for a visit.  - Violence - If anyone is threatening or hurting you, please call immediately.

## 2022-06-14 NOTE — Assessment & Plan Note (Signed)
Chronic, well controlled Continue hyzaar 100-25 Goal <130/<80

## 2022-06-14 NOTE — Assessment & Plan Note (Signed)
Acute, variable Request for imaging to assist

## 2022-07-02 ENCOUNTER — Other Ambulatory Visit: Payer: Self-pay | Admitting: Family Medicine

## 2022-08-12 NOTE — Progress Notes (Unsigned)
PCP: Jacky KindlePayne, Elise T, FNP   No chief complaint on file.   HPI:      [name] is a 57 y.o. [phone] who LMP was No LMP recorded. Patient is postmenopausal., presents today for her annual examination.  Her menses are absent due to menopause. Had some PMB when started HRT a couple yrs ago but has been off it over a year and has had 1 episode PMB about a yr ago, none recently. GYN u/s 6/21 was EM=4.8 mm and with several leio.  Has tolerable vasomotor sx, taking paxil with some relief.  Sex activity: sexually active. She does have vaginal dryness and is using estrace crm occas with sx control; doesn't need Rx RF currently.  Last Pap: 05/03/20 Results were: no abnormalities /neg HPV DNA with PCP.  Last mammogram: 08/03/21 with PCP;  Results were: normal--routine follow-up in 12 months; has appt 4/24 There is a FH of breast cancer in her sister, genetic testing not done and pt declined in past. There is no FH of ovarian cancer. The patient does self-breast exams.  Colonoscopy: age 57 without abnormalities;  Repeat due after 10 years.   Tobacco use: The patient denies current or previous tobacco use. Alcohol use: none  No drug use. Exercise: moderately active  She does get adequate calcium but not Vitamin D in her diet.  Labs with PCP.   Patient Active Problem List   Diagnosis Date Noted   Lipoma of torso 06/14/2022   Elevated cholesterol 06/14/2022   Annual physical exam 06/14/2022   Insomnia associated with menopause 12/05/2021   Family history of breast cancer 06/26/2021   Primary hypertension 06/01/2021   Screening mammogram, encounter for 01/19/2021   Hot flushes, perimenopausal 11/12/2017    Past Surgical History:  Procedure Laterality Date   LAPAROSCOPIC OVARIAN CYSTECTOMY Right     Family History  Problem Relation Age of Onset   Arthritis Mother    Rheum arthritis Mother    Brain cancer Father    Stroke Father    Breast cancer Sister         30s   Bone cancer Paternal Grandmother    Colon cancer Neg Hx     Social History   Socioeconomic History   Marital status: Married    Spouse name: Not on file   Number of children: Not on file   Years of education: Not on file   Highest education level: Not on file  Occupational History   Not on file  Tobacco Use   Smoking status: Never   Smokeless tobacco: Never  Vaping Use   Vaping Use: Never used  Substance and Sexual Activity   Alcohol use: Yes    Comment: infrequently   Drug use: Never   Sexual activity: Yes    Birth control/protection: None  Other Topics Concern   Not on file  Social History Narrative   Not on file   Social Determinants of Health   Financial Resource Strain: Not on file  Food Insecurity: Not on file  Transportation Needs: Not on file  Physical Activity: Not on file  Stress: Not on file  Social Connections: Not on file  Intimate Partner Violence: Not on file     Current Outpatient Medications:    hydrOXYzine (ATARAX) 10 MG tablet, Take 1 tablet (10 mg total) by mouth at bedtime as needed for anxiety., Disp: 90 tablet, Rfl: 3   losartan-hydrochlorothiazide (HYZAAR) 100-25 MG tablet, Take 1 tablet by mouth daily.,  Disp: 90 tablet, Rfl: 3   PARoxetine (PAXIL) 10 MG tablet, Take 1 tablet (10 mg total) by mouth daily., Disp: 90 tablet, Rfl: 3   rosuvastatin (CRESTOR) 5 MG tablet, TAKE 1 TABLET (5 MG TOTAL) BY MOUTH DAILY., Disp: 30 tablet, Rfl: 5     ROS:  Review of Systems  Constitutional:  Negative for fatigue, fever and unexpected weight change.  Respiratory:  Negative for cough, shortness of breath and wheezing.   Cardiovascular:  Negative for chest pain, palpitations and leg swelling.  Gastrointestinal:  Negative for blood in stool, constipation, diarrhea, nausea and vomiting.  Endocrine: Negative for cold intolerance, heat intolerance and polyuria.  Genitourinary:  Negative for dyspareunia, dysuria, flank pain, frequency, genital  sores, hematuria, menstrual problem, pelvic pain, urgency, vaginal bleeding, vaginal discharge and vaginal pain.  Musculoskeletal:  Negative for back pain, joint swelling and myalgias.  Skin:  Negative for rash.  Neurological:  Negative for dizziness, syncope, light-headedness, numbness and headaches.  Hematological:  Negative for adenopathy.  Psychiatric/Behavioral:  Negative for agitation, confusion, sleep disturbance and suicidal ideas. The patient is not nervous/anxious.   BREAST: No symptoms    Objective: There were no vitals taken for this visit.   Physical Exam Constitutional:      Appearance: She is well-developed.  Genitourinary:     Vulva normal.     Right Labia: No rash, tenderness or lesions.    Left Labia: No tenderness, lesions or rash.    No vaginal discharge, erythema or tenderness.      Right Adnexa: not tender and no mass present.    Left Adnexa: not tender and no mass present.    No cervical friability or polyp.     Uterus is not enlarged or tender.  Breasts:    Right: No mass, nipple discharge, skin change or tenderness.     Left: No mass, nipple discharge, skin change or tenderness.  Neck:     Thyroid: No thyromegaly.  Cardiovascular:     Rate and Rhythm: Normal rate and regular rhythm.     Heart sounds: Normal heart sounds. No murmur heard. Pulmonary:     Effort: Pulmonary effort is normal.     Breath sounds: Normal breath sounds.  Abdominal:     Palpations: Abdomen is soft.     Tenderness: There is no abdominal tenderness. There is no guarding or rebound.  Musculoskeletal:        General: Normal range of motion.     Cervical back: Normal range of motion.  Lymphadenopathy:     Cervical: No cervical adenopathy.  Neurological:     General: No focal deficit present.     Mental Status: She is alert and oriented to person, place, and time.     Cranial Nerves: No cranial nerve deficit.  Skin:    General: Skin is warm and dry.  Psychiatric:         Mood and Affect: Mood normal.        Behavior: Behavior normal.        Thought Content: Thought content normal.        Judgment: Judgment normal.  Vitals reviewed.     Assessment/Plan: Encounter for annual routine gynecological examination  Encounter for screening mammogram for malignant neoplasm of breast; pt has mammo appt  Family history of breast cancer--MyRisk testing discussed and pt declines. F/u prn.   Vaginal atrophy--uses estrace crm prn, doesn't need RF currently.           GYN  counsel breast self exam, mammography screening, use and side effects of HRT, menopause, adequate intake of calcium and vitamin D, diet and exercise    F/U  No follow-ups on file.  Liliana Brentlinger B. Damarious Holtsclaw, PA-C 08/12/2022 8:17 PM

## 2022-08-13 ENCOUNTER — Encounter: Payer: Self-pay | Admitting: Obstetrics and Gynecology

## 2022-08-13 ENCOUNTER — Ambulatory Visit: Payer: Managed Care, Other (non HMO) | Admitting: Obstetrics and Gynecology

## 2022-08-13 VITALS — BP 100/70 | Ht 67.0 in | Wt 151.0 lb

## 2022-08-13 DIAGNOSIS — Z01419 Encounter for gynecological examination (general) (routine) without abnormal findings: Secondary | ICD-10-CM | POA: Diagnosis not present

## 2022-08-13 DIAGNOSIS — N950 Postmenopausal bleeding: Secondary | ICD-10-CM

## 2022-08-13 DIAGNOSIS — N952 Postmenopausal atrophic vaginitis: Secondary | ICD-10-CM

## 2022-08-13 DIAGNOSIS — Z1231 Encounter for screening mammogram for malignant neoplasm of breast: Secondary | ICD-10-CM

## 2022-08-13 DIAGNOSIS — N951 Menopausal and female climacteric states: Secondary | ICD-10-CM

## 2022-08-13 DIAGNOSIS — Z803 Family history of malignant neoplasm of breast: Secondary | ICD-10-CM

## 2022-08-13 MED ORDER — ESTRADIOL 0.1 MG/GM VA CREA: TOPICAL_CREAM | VAGINAL | 0 refills | Status: AC

## 2022-08-13 MED ORDER — PAROXETINE HCL 10 MG PO TABS: 10.0000 mg | ORAL_TABLET | Freq: Every day | ORAL | 3 refills | Status: AC

## 2022-08-13 NOTE — Patient Instructions (Addendum)
I value your feedback and you entrusting us with your care. If you get a [facility] patient survey, I would appreciate you taking the time to let us know about your experience today. Thank you! ? ? ?

## 2022-08-16 ENCOUNTER — Ambulatory Visit (INDEPENDENT_AMBULATORY_CARE_PROVIDER_SITE_OTHER): Payer: Managed Care, Other (non HMO)

## 2022-08-16 DIAGNOSIS — N950 Postmenopausal bleeding: Secondary | ICD-10-CM

## 2022-08-16 DIAGNOSIS — N939 Abnormal uterine and vaginal bleeding, unspecified: Secondary | ICD-10-CM

## 2022-08-20 ENCOUNTER — Other Ambulatory Visit: Payer: Self-pay | Admitting: Obstetrics and Gynecology

## 2022-08-20 ENCOUNTER — Telehealth: Payer: Self-pay

## 2022-08-20 MED ORDER — NORETHINDRONE-ETH ESTRADIOL 0.5-2.5 MG-MCG PO TABS: 1.0000 | ORAL_TABLET | Freq: Every day | ORAL | 3 refills | Status: AC

## 2022-08-20 NOTE — Telephone Encounter (Signed)
Pt calling; she does want to start HRT.  [phone]

## 2022-08-20 NOTE — Progress Notes (Signed)
Rx femhrt HRT.
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
## 2022-08-20 NOTE — Telephone Encounter (Signed)
Pls let pt know I sent Rx to pharmacy. Take 1 pill daily at same time. Will take up to 3 months to reach max effectiveness. F/u if still sx. Otherwise, 1 yr Rx sent. Let me know if she has any questions. F/u prn vaginal bleeding, too.

## 2022-08-20 NOTE — Telephone Encounter (Signed)
Pt aware.

## 2022-08-21 ENCOUNTER — Encounter: Payer: Self-pay | Admitting: Family Medicine

## 2022-08-21 ENCOUNTER — Ambulatory Visit (INDEPENDENT_AMBULATORY_CARE_PROVIDER_SITE_OTHER): Payer: Managed Care, Other (non HMO) | Admitting: Family Medicine

## 2022-08-21 VITALS — BP 130/84 | HR 87 | Temp 98.0°F | Resp 13 | Ht 67.0 in | Wt 154.7 lb

## 2022-08-21 DIAGNOSIS — M25511 Pain in right shoulder: Secondary | ICD-10-CM | POA: Diagnosis not present

## 2022-08-21 MED ORDER — TIZANIDINE HCL 4 MG PO TABS
4.0000 mg | ORAL_TABLET | Freq: Four times a day (QID) | ORAL | 1 refills | Status: DC | PRN
Start: 2022-08-21 — End: 2022-10-03

## 2022-08-21 MED ORDER — MELOXICAM 15 MG PO TABS
15.0000 mg | ORAL_TABLET | Freq: Every day | ORAL | 0 refills | Status: DC
Start: 2022-08-21 — End: 2022-09-19
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
## 2022-08-22 ENCOUNTER — Telehealth: Payer: Self-pay

## 2022-08-22 NOTE — Telephone Encounter (Signed)
Pt returning Rose Marsh's call.  [phone]

## 2022-08-27 ENCOUNTER — Ambulatory Visit
Admission: RE | Admit: 2022-08-27 | Discharge: 2022-08-27 | Disposition: A | Discharge status: Home / Self Care | Payer: Managed Care, Other (non HMO) | Source: Ambulatory Visit | Attending: Family Medicine | Admitting: Family Medicine

## 2022-08-27 DIAGNOSIS — Z1231 Encounter for screening mammogram for malignant neoplasm of breast: Secondary | ICD-10-CM | POA: Diagnosis present

## 2022-08-29 NOTE — Progress Notes (Signed)
Hi Rose Marsh  Normal mammogram; repeat in 1 year.  Please let us know if you have any questions.  Thank you,  Merita Norton, FNP

## 2022-09-19 ENCOUNTER — Other Ambulatory Visit: Payer: Self-pay | Admitting: Family Medicine

## 2022-09-19 DIAGNOSIS — M25511 Pain in right shoulder: Secondary | ICD-10-CM

## 2022-09-19 NOTE — Telephone Encounter (Signed)
Is it ok to refill:  Last refill 08/21/2022, qty 30 with 0 refills. Last OV: 08/21/2022 No future OV scheduled

## 2022-10-03 ENCOUNTER — Other Ambulatory Visit: Payer: Self-pay | Admitting: Family Medicine

## 2022-10-03 DIAGNOSIS — M25511 Pain in right shoulder: Secondary | ICD-10-CM

## 2022-10-03 NOTE — Telephone Encounter (Signed)
Requested medication (s) are due for refill today: yes  Requested medication (s) are on the active medication list: yes    /Last refill     08/21/22/   #90  1 refill  Future visit scheduled No  Notes to clinic:Not delegated please review.  Requested Prescriptions  Pending Prescriptions Disp Refills   tiZANidine (ZANAFLEX) 4 MG tablet [Pharmacy Med Name: TIZANIDINE HCL 4 MG TABLET] 90 tablet 1    Sig: TAKE 1 TABLET BY MOUTH EVERY 6 HOURS AS NEEDED FOR MUSCLE SPASMS.     Not Delegated - Cardiovascular:  Alpha-2 Agonists - tizanidine Failed - 10/03/2022  2:46 AM      Failed - This refill cannot be delegated      Passed - Valid encounter within last 6 months    Recent Outpatient Visits           1 month ago Acute pain of right shoulder   [place] [facility] Jacky Kindle, FNP   3 months ago Annual physical exam   [facility] Jacky Kindle, FNP   10 months ago Primary hypertension   [facility] Merita Norton T, FNP   1 year ago Chronic right-sided low back pain with right-sided sciatica   [facility] Merita Norton T, FNP   1 year ago Chronic low back pain with sciatica, sciatica laterality unspecified, unspecified back pain laterality   [facility] [place], [place], [place]

## 2022-11-15 ENCOUNTER — Other Ambulatory Visit: Payer: Self-pay | Admitting: Family Medicine

## 2022-11-15 DIAGNOSIS — I10 Essential (primary) hypertension: Secondary | ICD-10-CM

## 2022-11-15 NOTE — Telephone Encounter (Signed)
Unable to refill per protocol, Rx request is too soon.  Requested Prescriptions  Pending Prescriptions Disp Refills   losartan-hydrochlorothiazide (HYZAAR) 100-25 MG tablet [Pharmacy Med Name: LOSARTAN-HCTZ 100-25 MG TAB] 30 tablet 5    Sig: TAKE 1 TABLET BY MOUTH EVERY DAY     Cardiovascular: ARB + Diuretic Combos Failed - 11/15/2022  1:23 PM      Failed - K in normal range and within 180 days    Potassium  Date Value Ref Range Status  12/05/2021 4.6 3.5 - 5.2 mmol/L Final         Failed - Na in normal range and within 180 days    Sodium  Date Value Ref Range Status  12/05/2021 141 134 - 144 mmol/L Final         Failed - Cr in normal range and within 180 days    Creatinine, Ser  Date Value Ref Range Status  12/05/2021 0.77 0.57 - 1.00 mg/dL Final         Failed - eGFR is 10 or above and within 180 days    GFR calc Af Amer  Date Value Ref Range Status  05/03/2020 90 >59 mL/min/1.73 Final    Comment:    **In accordance with recommendations from the NKF-ASN Task force,**   [facility] is in the process of updating its eGFR calculation to the   2021 CKD-EPI creatinine equation that estimates kidney function   without a race variable.    GFR calc non Af Amer  Date Value Ref Range Status  05/03/2020 78 >59 mL/min/1.73 Final   eGFR  Date Value Ref Range Status  12/05/2021 90 >59 mL/min/1.73 Final         Passed - Patient is not pregnant      Passed - Last BP in normal range    BP Readings from Last 1 Encounters:  08/21/22 130/84         Passed - Valid encounter within last 6 months    Recent Outpatient Visits           2 months ago Acute pain of right shoulder   [facility] [facility] Jacky Kindle, FNP   5 months ago Annual physical exam   [facility] Jacky Kindle, FNP   11 months ago Primary hypertension   [place] [facility] Merita Norton T, FNP   1 year ago Chronic right-sided low back pain with  right-sided sciatica   [facility] Merita Norton T, FNP   1 year ago Chronic low back pain with sciatica, sciatica laterality unspecified, unspecified back pain laterality   [facility] [place], [place], [place]

## 2022-11-19 IMAGING — CR DG LUMBAR SPINE COMPLETE 4+V
1 series · 5 of 5 positions shown · non-contrast
Comparison: 11/04/2019

CLINICAL DATA: Back pain for 6 months

EXAM:
LUMBAR SPINE - COMPLETE 4+ VIEW

[Series 1: dg lumbar spine complete 4 +v · 0.14mm/px · 5 of 5 slices shown]
[im 1/5]
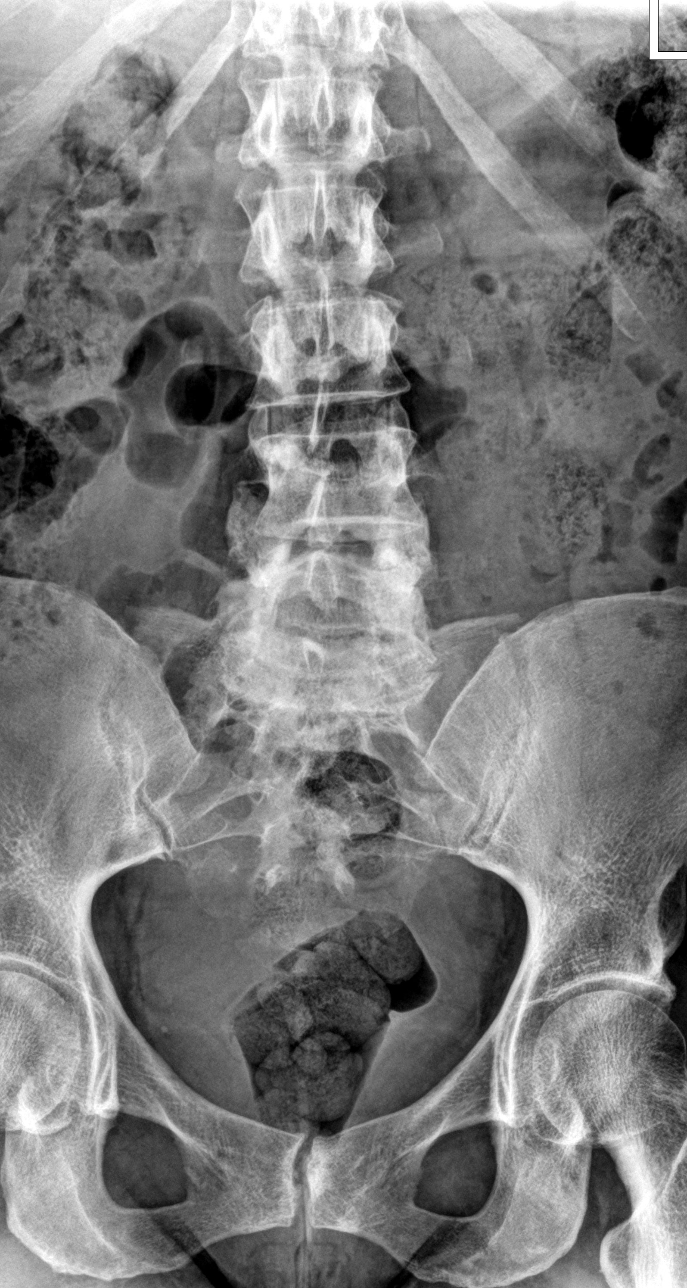
[im 2/5]
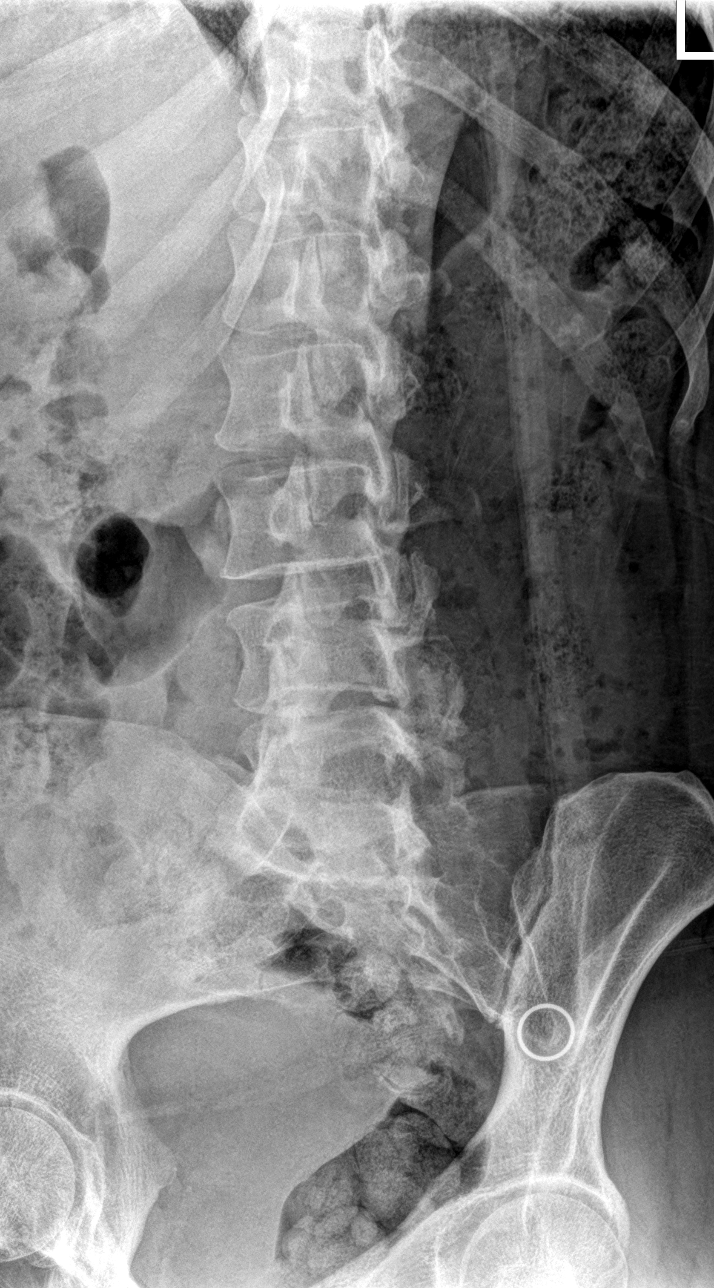
[im 3/5]
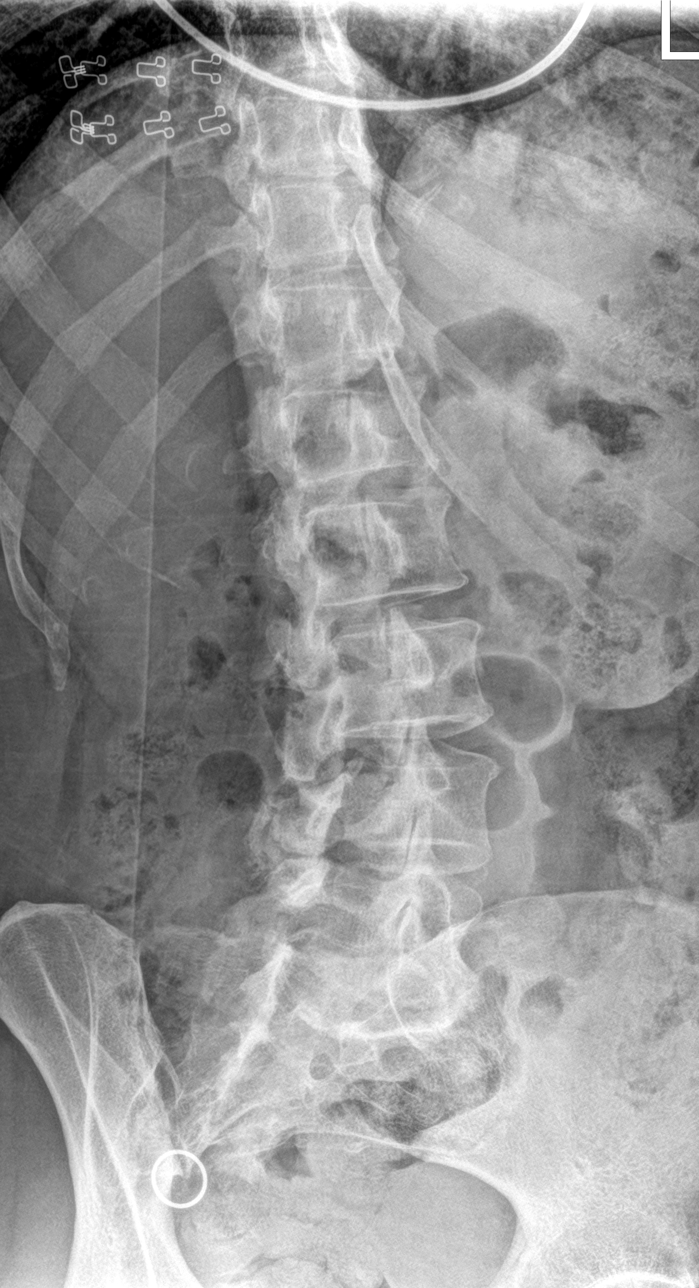
[im 4/5]
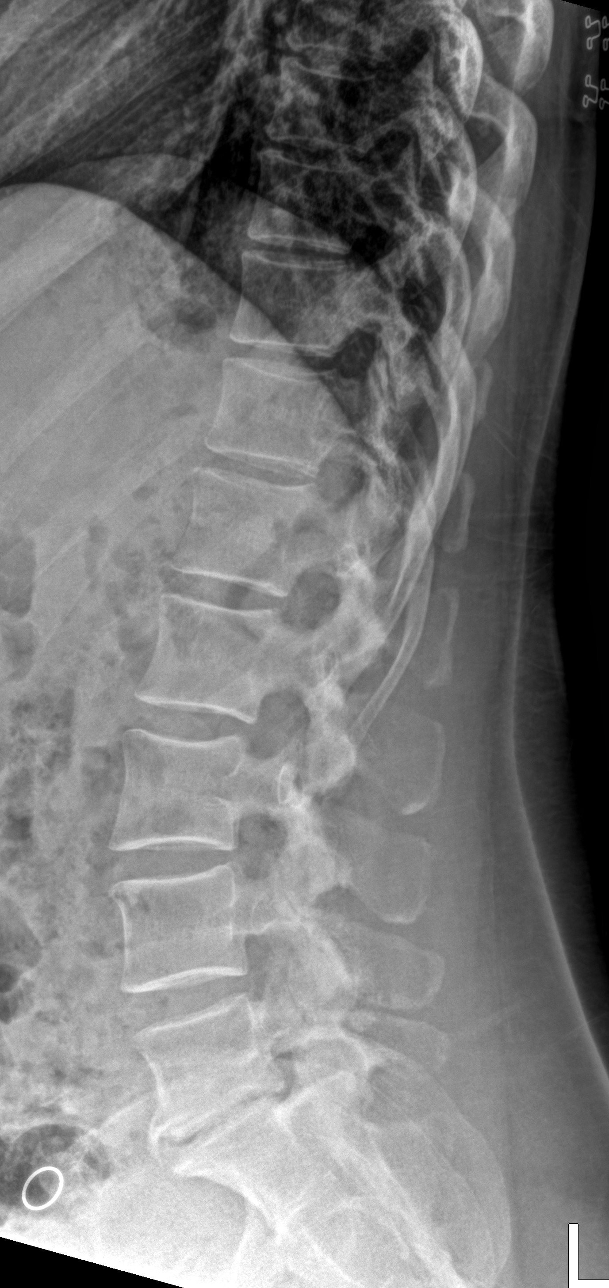
[im 5/5]
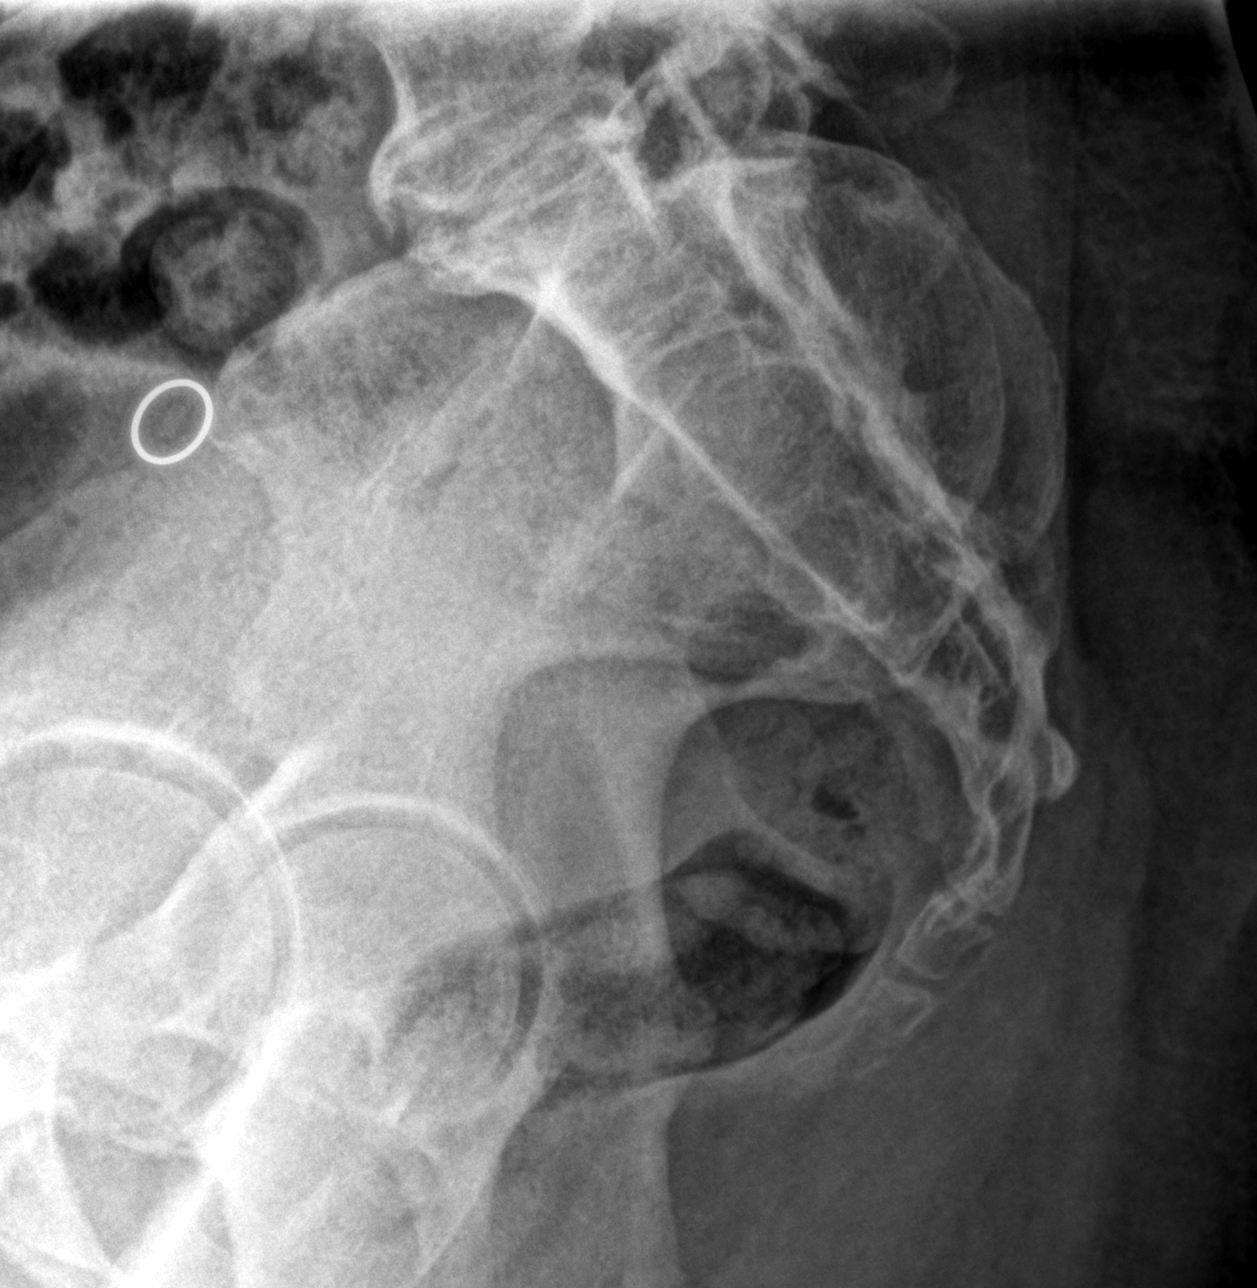

[5 of 5 positions shown; findings below may reference images not displayed]

FINDINGS: Frontal, bilateral oblique, lateral views of the lumbar spine are
obtained. There are 5 non-rib-bearing lumbar type vertebral bodies
identified in stable alignment. No acute fractures. There is stable
moderate spondylosis at L5-S1. Marked facet hypertrophic changes
from L3-4 through L5-S1 are also stable. Remaining disc spaces are
well preserved. Sacroiliac joints are unremarkable.
IMPRESSION: 1. Stable lower lumbar spondylosis and facet hypertrophy, greatest
at L5-S1.

## 2022-12-11 ENCOUNTER — Other Ambulatory Visit: Payer: Self-pay | Admitting: Family Medicine

## 2022-12-11 DIAGNOSIS — N951 Menopausal and female climacteric states: Secondary | ICD-10-CM

## 2022-12-13 ENCOUNTER — Other Ambulatory Visit: Payer: Self-pay | Admitting: Family Medicine

## 2022-12-13 NOTE — Telephone Encounter (Signed)
Requested medication (s) are due for refill today: Yes  Requested medication (s) are on the active medication list: Yes  Last refill:  07/02/22  Future visit scheduled: No  Notes to clinic:  Unable to refill per protocol due to failed labs, no updated results.      Requested Prescriptions  Pending Prescriptions Disp Refills   rosuvastatin (CRESTOR) 5 MG tablet [Pharmacy Med Name: ROSUVASTATIN CALCIUM 5 MG TAB] 30 tablet 5    Sig: TAKE 1 TABLET (5 MG TOTAL) BY MOUTH DAILY.     Cardiovascular:  Antilipid - Statins 2 Failed - 12/13/2022  2:29 AM      Failed - Cr in normal range and within 360 days    Creatinine, Ser  Date Value Ref Range Status  12/05/2021 0.77 0.57 - 1.00 mg/dL Final         Failed - Lipid Panel in normal range within the last 12 months    Cholesterol, Total  Date Value Ref Range Status  12/05/2021 219 (H) 100 - 199 mg/dL Final   LDL Chol Calc (NIH)  Date Value Ref Range Status  12/05/2021 118 (H) 0 - 99 mg/dL Final   HDL  Date Value Ref Range Status  12/05/2021 89 >39 mg/dL Final   Triglycerides  Date Value Ref Range Status  12/05/2021 66 0 - 149 mg/dL Final         Passed - Patient is not pregnant      Passed - Valid encounter within last 12 months    Recent Outpatient Visits           3 months ago Acute pain of right shoulder   [place] [facility] Jacky Kindle, FNP   6 months ago Annual physical exam   [facility] Jacky Kindle, FNP   1 year ago Primary hypertension    [facility] Merita Norton T, FNP   1 year ago Chronic right-sided low back pain with right-sided sciatica   [facility] Merita Norton T, FNP   1 year ago Chronic low back pain with sciatica, sciatica laterality unspecified, unspecified back pain laterality   [facility] [place], [place], [place]

## 2022-12-18 ENCOUNTER — Ambulatory Visit: Payer: Managed Care, Other (non HMO) | Admitting: Family Medicine

## 2022-12-18 VITALS — BP 114/82 | HR 76 | Ht 67.0 in | Wt 161.0 lb

## 2022-12-18 DIAGNOSIS — M25551 Pain in right hip: Secondary | ICD-10-CM | POA: Diagnosis not present

## 2022-12-18 MED ORDER — PREDNISONE 50 MG PO TABS
50.0000 mg | ORAL_TABLET | Freq: Every day | ORAL | 0 refills | Status: AC
Start: 2022-12-18 — End: ?

## 2022-12-18 MED ORDER — GABAPENTIN 600 MG PO TABS
600.0000 mg | ORAL_TABLET | Freq: Every day | ORAL | 0 refills | Status: AC
Start: 2022-12-18 — End: ?

## 2022-12-18 MED ORDER — CELECOXIB 200 MG PO CAPS
200.0000 mg | ORAL_CAPSULE | Freq: Two times a day (BID) | ORAL | 0 refills | Status: AC
Start: 2022-12-18 — End: ?

## 2022-12-18 NOTE — Progress Notes (Signed)
Established patient visit   Patient: Rose Marsh   DOB: 05-15-1965   57 y.o. Female  MRN: [id number] Visit Date: 12/18/2022  Today's healthcare provider: Jacky Kindle, FNP  Introduced to nurse practitioner role and practice setting.  All questions answered.  Discussed provider/patient relationship and expectations.  Subjective    HPI HPI     Pelvic Pain    Additional comments: Present for about 2 weeks and has progressively worsening        Medication Consultation    Additional comments: Patient would also like to discuss estrogeen started at her last visit       Last edited by Acey Lav, CMA on 12/18/2022  1:52 PM.      Medications: Outpatient Medications Prior to Visit  Medication Sig   estradiol (ESTRACE VAGINAL) 0.1 MG/GM vaginal cream Insert 1 g vaginally wkly as maintenance   hydrOXYzine (ATARAX) 10 MG tablet TAKE 1 TABLET (10 MG TOTAL) BY MOUTH AT BEDTIME AS NEEDED FOR ANXIETY   losartan-hydrochlorothiazide (HYZAAR) 100-25 MG tablet Take 1 tablet by mouth daily.   norethindrone-ethinyl estradiol (FEMHRT LOW DOSE) 0.5-2.5 MG-MCG tablet Take 1 tablet by mouth daily.   PARoxetine (PAXIL) 10 MG tablet Take 1 tablet (10 mg total) by mouth daily.   rosuvastatin (CRESTOR) 5 MG tablet TAKE 1 TABLET (5 MG TOTAL) BY MOUTH DAILY.   [DISCONTINUED] meloxicam (MOBIC) 15 MG tablet TAKE 1 TABLET (15 MG TOTAL) BY MOUTH DAILY.   [DISCONTINUED] tiZANidine (ZANAFLEX) 4 MG tablet TAKE 1 TABLET BY MOUTH EVERY 6 HOURS AS NEEDED FOR MUSCLE SPASMS.   No facility-administered medications prior to visit.    Review of Systems    Objective    BP 114/82 (BP Location: Left Arm, Patient Position: Sitting, Cuff Size: Normal)   Pulse 76   Ht 5\' 7"  (1.702 m)   Wt 161 lb (73 kg)   SpO2 100%   BMI 25.22 kg/m   Physical Exam Vitals and nursing note reviewed.  Constitutional:      General: She is not in acute distress.    Appearance: Normal appearance. She is  normal weight. She is not ill-appearing, toxic-appearing or diaphoretic.  HENT:     Head: Normocephalic and atraumatic.  Cardiovascular:     Rate and Rhythm: Normal rate and regular rhythm.     Pulses: Normal pulses.     Heart sounds: Normal heart sounds. No murmur heard.    No friction rub. No gallop.  Pulmonary:     Effort: Pulmonary effort is normal. No respiratory distress.     Breath sounds: Normal breath sounds. No stridor. No wheezing, rhonchi or rales.  Chest:     Chest wall: No tenderness.  Abdominal:     General: Bowel sounds are normal. There is no distension.     Palpations: Abdomen is soft. There is no mass.     Tenderness: There is no abdominal tenderness. There is no right CVA tenderness, left CVA tenderness, guarding or rebound.     Hernia: No hernia is present.  Musculoskeletal:        General: Tenderness present. No swelling, deformity or signs of injury. Normal range of motion.     Right lower leg: No edema.     Left lower leg: No edema.     Comments: R hip pain and tightness; positive SLR on both Difficulty with bent knee and rotated hip exam R side  Skin:    General: Skin is warm and dry.  Capillary Refill: Capillary refill takes less than 2 seconds.     Coloration: Skin is not jaundiced or pale.     Findings: No bruising, erythema, lesion or rash.  Neurological:     General: No focal deficit present.     Mental Status: She is alert and oriented to person, place, and time. Mental status is at baseline.     Cranial Nerves: No cranial nerve deficit.     Sensory: No sensory deficit.     Motor: Weakness present.     Coordination: Coordination normal.     Comments: Slight deficit in weight bearing   Psychiatric:        Mood and Affect: Mood normal.        Behavior: Behavior normal.        Thought Content: Thought content normal.        Judgment: Judgment normal.     No results found for any visits on 12/18/22.  Assessment & Plan     Problem List  Items Addressed This Visit       Other   Right hip pain - Primary    Concern for combination of radiculopathy in lumbar region vs piriformis or hip bursitis Referral placed Start multi modal treatment       Relevant Medications   celecoxib (CELEBREX) 200 MG capsule   predniSONE (DELTASONE) 50 MG tablet   gabapentin (NEURONTIN) 600 MG tablet   Other Relevant Orders   Ambulatory referral to Sports Medicine    Return if symptoms worsen or fail to improve.      Leilani Merl, FNP, have reviewed all documentation for this visit. The documentation on 12/18/22 for the exam, diagnosis, procedures, and orders are all accurate and complete.  Jacky Kindle, FNP  [facility] [phone] (phone) [phone] (fax)  [facility]

## 2022-12-18 NOTE — Assessment & Plan Note (Signed)
Concern for combination of radiculopathy in lumbar region vs piriformis or hip bursitis Referral placed Start multi modal treatment

## 2022-12-19 ENCOUNTER — Telehealth: Payer: Self-pay

## 2022-12-19 NOTE — Telephone Encounter (Signed)
Copied from CRM [id number]. Topic: General - Inquiry >> Dec 19, 2022 12:01 PM De Blanch wrote: Reason for CRM: Pt is returning [place], [place] call.  Requesting a callback.   Please advise.

## 2023-01-01 ENCOUNTER — Ambulatory Visit: Payer: Managed Care, Other (non HMO) | Admitting: Family Medicine

## 2023-01-07 ENCOUNTER — Other Ambulatory Visit: Payer: Self-pay | Admitting: Family Medicine

## 2023-03-01 ENCOUNTER — Encounter: Payer: Self-pay | Admitting: Family Medicine

## 2023-03-01 DIAGNOSIS — I10 Essential (primary) hypertension: Secondary | ICD-10-CM

## 2023-03-03 MED ORDER — ROSUVASTATIN CALCIUM 5 MG PO TABS: 5.0000 mg | ORAL_TABLET | Freq: Every day | ORAL | 1 refills | Status: AC

## 2023-03-03 MED ORDER — LOSARTAN POTASSIUM-HCTZ 100-25 MG PO TABS: 1.0000 | ORAL_TABLET | Freq: Every day | ORAL | 3 refills | Status: AC

## 2023-03-21 ENCOUNTER — Other Ambulatory Visit: Payer: Self-pay | Admitting: Medical Genetics

## 2023-03-21 DIAGNOSIS — Z006 Encounter for examination for normal comparison and control in clinical research program: Secondary | ICD-10-CM

## 2023-03-26 ENCOUNTER — Other Ambulatory Visit
Admission: RE | Admit: 2023-03-26 | Discharge: 2023-03-26 | Disposition: A | Discharge status: Home / Self Care | Payer: Managed Care, Other (non HMO) | Source: Ambulatory Visit | Attending: Medical Genetics | Admitting: Medical Genetics

## 2023-03-26 DIAGNOSIS — Z006 Encounter for examination for normal comparison and control in clinical research program: Secondary | ICD-10-CM

## 2023-04-10 LAB — GENECONNECT MOLECULAR SCREEN: Genetic Analysis Overall Interpretation: NEGATIVE

## 2023-05-06 ENCOUNTER — Telehealth: Payer: Self-pay

## 2023-05-08 ENCOUNTER — Encounter: Payer: Self-pay | Admitting: Family Medicine

## 2023-05-09 NOTE — Telephone Encounter (Signed)
 Patient clld stated the note [name] left on 05/08/23 was fine.

## 2023-06-04 ENCOUNTER — Telehealth: Payer: Self-pay

## 2023-06-04 NOTE — Telephone Encounter (Signed)
Opened in error. Please disregard.

## 2023-06-04 NOTE — Telephone Encounter (Signed)
Pt labs never ordered or completed. Form has not been completed to be picked up.  Contacted patient to see if forms still needed. She reports forms can be discarded as they are no longer needed.

## 2023-07-22 ENCOUNTER — Other Ambulatory Visit: Payer: Self-pay | Admitting: Obstetrics and Gynecology

## 2023-08-23 ENCOUNTER — Other Ambulatory Visit: Payer: Self-pay | Admitting: Obstetrics and Gynecology

## 2023-08-23 DIAGNOSIS — N951 Menopausal and female climacteric states: Secondary | ICD-10-CM

## 2023-10-17 ENCOUNTER — Encounter: Admitting: Family Medicine

## 2023-12-03 ENCOUNTER — Telehealth: Payer: Self-pay

## 2023-12-03 NOTE — Telephone Encounter (Signed)
 Patient returned your call and wanted to go ahead and schedule her colonoscopy when you have a chance. Thank you.
